# Patient Record
Sex: Female | Born: 1955 | Race: White | Hispanic: No | Smoking: Former smoker
Health system: Southern US, Community
[De-identification: ages and names within clinical notes are randomized; demographics above are authoritative.]

## PROBLEM LIST (undated history)

## (undated) DIAGNOSIS — Z8619 Personal history of other infectious and parasitic diseases: Secondary | ICD-10-CM

## (undated) DIAGNOSIS — E669 Obesity, unspecified: Secondary | ICD-10-CM

## (undated) DIAGNOSIS — K7581 Nonalcoholic steatohepatitis (NASH): Secondary | ICD-10-CM

## (undated) DIAGNOSIS — K635 Polyp of colon: Secondary | ICD-10-CM

## (undated) DIAGNOSIS — R7989 Other specified abnormal findings of blood chemistry: Secondary | ICD-10-CM

## (undated) DIAGNOSIS — A389 Scarlet fever, uncomplicated: Secondary | ICD-10-CM

## (undated) DIAGNOSIS — R519 Headache, unspecified: Secondary | ICD-10-CM

## (undated) DIAGNOSIS — K589 Irritable bowel syndrome without diarrhea: Secondary | ICD-10-CM

## (undated) DIAGNOSIS — N390 Urinary tract infection, site not specified: Secondary | ICD-10-CM

## (undated) DIAGNOSIS — M797 Fibromyalgia: Secondary | ICD-10-CM

## (undated) DIAGNOSIS — D72829 Elevated white blood cell count, unspecified: Secondary | ICD-10-CM

## (undated) DIAGNOSIS — K5792 Diverticulitis of intestine, part unspecified, without perforation or abscess without bleeding: Secondary | ICD-10-CM

## (undated) DIAGNOSIS — R87619 Unspecified abnormal cytological findings in specimens from cervix uteri: Secondary | ICD-10-CM

## (undated) DIAGNOSIS — M1990 Unspecified osteoarthritis, unspecified site: Secondary | ICD-10-CM

## (undated) DIAGNOSIS — R51 Headache: Secondary | ICD-10-CM

## (undated) DIAGNOSIS — E785 Hyperlipidemia, unspecified: Secondary | ICD-10-CM

## (undated) HISTORY — PX: ABDOMINAL HYSTERECTOMY: SUR658

## (undated) HISTORY — DX: Urinary tract infection, site not specified: N39.0

## (undated) HISTORY — DX: Hyperlipidemia, unspecified: E78.5

## (undated) HISTORY — DX: Scarlet fever, uncomplicated: A38.9

## (undated) HISTORY — PX: TONSILLECTOMY: SUR1361

## (undated) HISTORY — DX: Nonalcoholic steatohepatitis (NASH): K75.81

## (undated) HISTORY — DX: Personal history of other infectious and parasitic diseases: Z86.19

## (undated) HISTORY — DX: Unspecified abnormal cytological findings in specimens from cervix uteri: R87.619

## (undated) HISTORY — DX: Elevated white blood cell count, unspecified: D72.829

## (undated) HISTORY — DX: Other specified abnormal findings of blood chemistry: R79.89

## (undated) HISTORY — DX: Unspecified osteoarthritis, unspecified site: M19.90

## (undated) HISTORY — DX: Polyp of colon: K63.5

## (undated) HISTORY — PX: COLPOSCOPY: SHX161

## (undated) HISTORY — DX: Obesity, unspecified: E66.9

## (undated) HISTORY — DX: Irritable bowel syndrome, unspecified: K58.9

---

## 1999-10-03 ENCOUNTER — Other Ambulatory Visit: Admission: RE | Admit: 1999-10-03 | Discharge: 1999-10-03 | Payer: Self-pay | Admitting: Obstetrics and Gynecology

## 2000-12-15 ENCOUNTER — Other Ambulatory Visit: Admission: RE | Admit: 2000-12-15 | Discharge: 2000-12-15 | Payer: Self-pay | Admitting: Obstetrics and Gynecology

## 2002-08-15 ENCOUNTER — Other Ambulatory Visit: Admission: RE | Admit: 2002-08-15 | Discharge: 2002-08-15 | Payer: Self-pay | Admitting: Obstetrics and Gynecology

## 2003-09-05 ENCOUNTER — Other Ambulatory Visit: Admission: RE | Admit: 2003-09-05 | Discharge: 2003-09-05 | Payer: Self-pay | Admitting: Obstetrics and Gynecology

## 2006-04-28 ENCOUNTER — Other Ambulatory Visit: Admission: RE | Admit: 2006-04-28 | Discharge: 2006-04-28 | Payer: Self-pay | Admitting: Obstetrics and Gynecology

## 2008-07-10 ENCOUNTER — Other Ambulatory Visit: Admission: RE | Admit: 2008-07-10 | Discharge: 2008-07-10 | Payer: Self-pay | Admitting: Obstetrics and Gynecology

## 2009-09-12 ENCOUNTER — Encounter: Admission: RE | Admit: 2009-09-12 | Discharge: 2009-09-12 | Payer: Self-pay | Admitting: Obstetrics and Gynecology

## 2010-10-01 ENCOUNTER — Other Ambulatory Visit: Payer: Self-pay | Admitting: Obstetrics and Gynecology

## 2014-03-07 ENCOUNTER — Encounter: Payer: Self-pay | Admitting: Obstetrics and Gynecology

## 2014-04-04 ENCOUNTER — Telehealth: Payer: Self-pay | Admitting: Obstetrics and Gynecology

## 2014-04-04 NOTE — Telephone Encounter (Signed)
Confirming pts appt

## 2014-04-09 NOTE — Telephone Encounter (Signed)
confirmed

## 2014-04-12 ENCOUNTER — Encounter: Payer: Self-pay | Admitting: Obstetrics and Gynecology

## 2014-05-17 ENCOUNTER — Encounter: Payer: Self-pay | Admitting: Hematology and Oncology

## 2014-05-17 ENCOUNTER — Encounter (INDEPENDENT_AMBULATORY_CARE_PROVIDER_SITE_OTHER): Payer: Self-pay

## 2014-05-17 ENCOUNTER — Ambulatory Visit (HOSPITAL_BASED_OUTPATIENT_CLINIC_OR_DEPARTMENT_OTHER): Payer: BC Managed Care – PPO | Admitting: Hematology and Oncology

## 2014-05-17 ENCOUNTER — Ambulatory Visit: Payer: BC Managed Care – PPO

## 2014-05-17 ENCOUNTER — Telehealth: Payer: Self-pay | Admitting: Hematology and Oncology

## 2014-05-17 VITALS — BP 149/85 | HR 93 | Temp 98.8°F | Resp 18 | Wt 197.0 lb

## 2014-05-17 DIAGNOSIS — D72829 Elevated white blood cell count, unspecified: Secondary | ICD-10-CM

## 2014-05-17 DIAGNOSIS — M2550 Pain in unspecified joint: Secondary | ICD-10-CM

## 2014-05-17 DIAGNOSIS — R5382 Chronic fatigue, unspecified: Secondary | ICD-10-CM

## 2014-05-17 DIAGNOSIS — G8929 Other chronic pain: Secondary | ICD-10-CM | POA: Insufficient documentation

## 2014-05-17 NOTE — Assessment & Plan Note (Signed)
She was seen by rheumatologist and was told she has osteoarthritis. I recommend OTC vitamin D supplement and trial of glucosamine with graduated exercise as tolerated

## 2014-05-17 NOTE — Progress Notes (Signed)
Checked in new pt with no financial concerns. °

## 2014-05-17 NOTE — Assessment & Plan Note (Signed)
I gave her advice of graduated exercise, dietary modification and weight loss with goal for 10 pound weight loss over the next 6 months

## 2014-05-17 NOTE — Assessment & Plan Note (Signed)
She felt that she may have chronic Lyme disease. I disagree. I recommend ID consult but she declined. With her obesity, I suggest evaluation for possible OSA but she declined

## 2014-05-17 NOTE — Assessment & Plan Note (Signed)
This is likely reactive in nature from obesity and osteoarthritis. It has been stable for over 3 years. The yield of bone marrow biopsy is low and I recommend observation.

## 2014-05-17 NOTE — Telephone Encounter (Signed)
Gave avs & cal for May 2016.

## 2014-05-17 NOTE — Progress Notes (Signed)
[place] NOTE  Patient Care Team: Aretta Nip, MD as PCP - General (Family Medicine)  CHIEF COMPLAINTS/PURPOSE OF CONSULTATION:  Chronic leukocytosis  HISTORY OF PRESENTING ILLNESS:  Denise Munoz 58 y.o. female is here because of elevated WBC.  She was found to have abnormal CBC from routine blood work monitoring by PCP. From December 2012 to present, her WBC ranged from 14.4 to 15.5 with normal differential without other abnormalities. She denies recent infection. The last prescription antibiotics was more than 3 months ago. She had recurrent UTI X 3 over the past 18 months. There is not reported symptoms of sinus congestion, cough, urinary frequency/urgency or dysuria, diarrhea, or abnormal skin rash. She had tick bite 2 years ago and felt she had persistent infection. She has chronic fatigue syndrome but did not think she has OSA because she had made recording of her sleep and breathing not long ago. She complained of severe neck pain and joint pain and was seen by a rheumatologist, with autoimmune disorders ruled out. She had no prior history or diagnosis of cancer. Her age appropriate screening programs are up-to-date. The patient has no prior diagnosis of autoimmune disease and was not prescribed corticosteroids related products. She was an ex-smoker  MEDICAL HISTORY:  Past Medical History  Diagnosis Date  . Arthritis   . Leukocytosis   . Scarlet fever   . Leukocytosis 05/17/2014    SURGICAL HISTORY: Past Surgical History  Procedure Laterality Date  . Tonsillectomy      SOCIAL HISTORY: History   Social History  . Marital Status: Unknown    Spouse Name: N/A    Number of Children: N/A  . Years of Education: N/A   Occupational History  . Not on file.   Social History Main Topics  . Smoking status: Former Smoker -- 0.50 packs/day for 36 years    Quit date: 05/17/2010  . Smokeless tobacco: Never Used  . Alcohol Use: No  . Drug Use: No   . Sexual Activity: Not on file   Other Topics Concern  . Not on file   Social History Narrative  . No narrative on file    FAMILY HISTORY: Family History  Problem Relation Age of Onset  . Cancer Mother 29    breast ca    ALLERGIES:  has no allergies on file.  MEDICATIONS:  Current Outpatient Prescriptions  Medication Sig Dispense Refill  . acetaminophen (TYLENOL) 325 MG tablet Take 650 mg by mouth every 6 (six) hours as needed.    . cyclobenzaprine (FLEXERIL) 10 MG tablet Take 10 mg by mouth at bedtime as needed for muscle spasms.    Marland Kitchen ibuprofen (ADVIL,MOTRIN) 200 MG tablet Take 200 mg by mouth every 6 (six) hours as needed.    . Multiple Vitamin (MULTIVITAMIN) tablet Take 1 tablet by mouth daily.     No current facility-administered medications for this visit.    REVIEW OF SYSTEMS:   Constitutional: Denies fevers, chills or abnormal night sweats Eyes: Denies blurriness of vision, double vision or watery eyes Ears, nose, mouth, throat, and face: Denies mucositis or sore throat Respiratory: Denies cough, dyspnea or wheezes Cardiovascular: Denies palpitation, chest discomfort or lower extremity swelling Gastrointestinal:  Denies nausea, heartburn or change in bowel habits Skin: Denies abnormal skin rashes Lymphatics: Denies new lymphadenopathy or easy bruising Neurological:Denies numbness, tingling or new weaknesses Behavioral/Psych: Mood is stable, no new changes  All other systems were reviewed with the patient and are negative.  PHYSICAL  EXAMINATION: ECOG PERFORMANCE STATUS: 1 - Symptomatic but completely ambulatory  Filed Vitals:   05/17/14 1349  BP: 149/85  Pulse: 93  Temp: 98.8 F (37.1 C)  Resp: 18   Filed Weights   05/17/14 1349  Weight: 197 lb (89.359 kg)    GENERAL:alert, no distress and comfortable. She is morbidly obese SKIN: skin color, texture, turgor are normal, no rashes or significant lesions EYES: normal, conjunctiva are pink and  non-injected, sclera clear OROPHARYNX:no exudate, no erythema and lips, buccal mucosa, and tongue normal  NECK: supple, thyroid normal size, non-tender, without nodularity LYMPH:  no palpable lymphadenopathy in the cervical, axillary or inguinal LUNGS: clear to auscultation and percussion with normal breathing effort HEART: regular rate & rhythm and no murmurs and no lower extremity edema ABDOMEN:abdomen soft, non-tender and normal bowel sounds Musculoskeletal:no cyanosis of digits and no clubbing  PSYCH: alert & oriented x 3 with fluent speech NEURO: no focal motor/sensory deficits  LABORATORY DATA:  I have reviewed the data as listed  ASSESSMENT & PLAN Leukocytosis This is likely reactive in nature from obesity and osteoarthritis. It has been stable for over 3 years. The yield of bone marrow biopsy is low and I recommend observation.  Morbid obesity I gave her advice of graduated exercise, dietary modification and weight loss with goal for 10 pound weight loss over the next 6 months  Chronic fatigue She felt that she may have chronic Lyme disease. I disagree. I recommend ID consult but she declined. With her obesity, I suggest evaluation for possible OSA but she declined  Chronic pain of multiple joints She was seen by rheumatologist and was told she has osteoarthritis. I recommend OTC vitamin D supplement and trial of glucosamine with graduated exercise as tolerated

## 2014-05-18 ENCOUNTER — Encounter: Payer: Self-pay | Admitting: Obstetrics and Gynecology

## 2014-05-21 ENCOUNTER — Ambulatory Visit (INDEPENDENT_AMBULATORY_CARE_PROVIDER_SITE_OTHER): Payer: BC Managed Care – PPO | Admitting: Obstetrics and Gynecology

## 2014-05-21 ENCOUNTER — Encounter: Payer: Self-pay | Admitting: Obstetrics and Gynecology

## 2014-05-21 VITALS — BP 140/98 | HR 88 | Resp 16 | Ht 61.5 in | Wt 198.0 lb

## 2014-05-21 DIAGNOSIS — R319 Hematuria, unspecified: Secondary | ICD-10-CM

## 2014-05-21 DIAGNOSIS — Z01419 Encounter for gynecological examination (general) (routine) without abnormal findings: Secondary | ICD-10-CM

## 2014-05-21 DIAGNOSIS — Z0000 Encounter for general adult medical examination without abnormal findings: Secondary | ICD-10-CM

## 2014-05-21 DIAGNOSIS — R1900 Intra-abdominal and pelvic swelling, mass and lump, unspecified site: Secondary | ICD-10-CM

## 2014-05-21 LAB — POCT URINALYSIS DIPSTICK
Bilirubin, UA: NEGATIVE
GLUCOSE UA: NEGATIVE
Ketones, UA: NEGATIVE
LEUKOCYTES UA: NEGATIVE
NITRITE UA: NEGATIVE
PROTEIN UA: NEGATIVE
UROBILINOGEN UA: NEGATIVE
pH, UA: 5

## 2014-05-21 NOTE — Patient Instructions (Signed)

## 2014-05-21 NOTE — Progress Notes (Signed)
58 y.o. G1P1000 UnknownCaucasianF here for annual exam.    PCP - Milagros Evener, MD  Sees Dr. Alvy Bimler for leukocytosis, joint pain and fatigue following a tick bite.  Had elevated sed rate.   No prior testing for diabetes.   3 UTIs in the last year.  Some mild genuine stress incontinence.   Has spent time in [place] many year ago.   Patient's last menstrual period was 05/13/2006.          Sexually active: No.  The current method of family planning is post menopausal status.    Exercising: No.  The patient does not participate in regular exercise at present. Smoker:  no  Health Maintenance: Pap:  05/2012 neg History of abnormal Pap:  yes MMG:  09/2009 BIRADS1: neg Colonoscopy:  N/A BMD:   N/A TDaP:  Within 5 years  Screening Labs: PCP, Hb today: PCP, Urine today: LAG:TXMIW. No dysuria.  Some frequency.    reports that she quit smoking about 4 years ago. She has never used smokeless tobacco. She reports that she does not drink alcohol or use illicit drugs.  Past Medical History  Diagnosis Date  . Arthritis   . Leukocytosis   . Scarlet fever   . Leukocytosis 05/17/2014  . Abnormal Pap smear of cervix ~1986  . History of mumps as a child     Past Surgical History  Procedure Laterality Date  . Tonsillectomy    . Colposcopy  ~1986    Normal    Current Outpatient Prescriptions  Medication Sig Dispense Refill  . acetaminophen (TYLENOL) 325 MG tablet Take 650 mg by mouth every 6 (six) hours as needed.    . cyclobenzaprine (FLEXERIL) 10 MG tablet Take 10 mg by mouth at bedtime as needed for muscle spasms.    Marland Kitchen ibuprofen (ADVIL,MOTRIN) 200 MG tablet Take 200 mg by mouth every 6 (six) hours as needed.    . Multiple Vitamin (MULTIVITAMIN) tablet Take 1 tablet by mouth daily.     No current facility-administered medications for this visit.    Family History  Problem Relation Age of Onset  . Breast cancer Mother 70  . Alzheimer's disease Father 48  . Breast cancer  Maternal Grandmother 70    ROS:  Pertinent items are noted in HPI.  Otherwise, a comprehensive ROS was negative.  Exam:   BP 140/98 mmHg  Pulse 88  Resp 16  Ht 5' 1.5" (1.562 m)  Wt 198 lb (89.812 kg)  BMI 36.81 kg/m2  LMP 05/13/2006     Height: 5' 1.5" (156.2 cm)  Ht Readings from Last 3 Encounters:  05/21/14 5' 1.5" (1.562 m)    General appearance: alert, cooperative and appears stated age Head: Normocephalic, without obvious abnormality, atraumatic Neck: no adenopathy, supple, symmetrical, trachea midline and thyroid normal to inspection and palpation Lungs: clear to auscultation bilaterally Breasts: normal appearance, no masses or tenderness, Inspection negative, No nipple retraction or dimpling, No nipple discharge or bleeding, No axillary or supraclavicular adenopathy Heart: regular rate and rhythm Abdomen: soft, non-tender; bowel sounds normal; no masses,  no organomegaly Extremities: extremities normal, atraumatic, no cyanosis or edema Skin: Skin color, texture, turgor normal. No rashes or lesions Lymph nodes: Cervical, supraclavicular, and axillary nodes normal. No abnormal inguinal nodes palpated Neurologic: Grossly normal   Pelvic: External genitalia:  no lesions              Urethra:  normal appearing urethra with no masses, tenderness or lesions  Bartholins and Skenes: normal                 Vagina: normal appearing vagina with normal color and discharge, no lesions              Cervix: no lesions              Pap taken: Yes.   Bimanual Exam:  Uterus:  mass  14 week size mass - Fibroid?              Adnexa: Not palpated separately from the uterus.                Rectovaginal: Confirms               Anus:  normal sphincter tone, no lesions  A:  Well Woman with normal exam Microscopic hematuria. History of recurrent UTIs.  Family history of breast cancer.  Pelvic mass.   P:   Mammogram due.  Patient will schedule. pap smear and HR HPV testing  performed. Return for pelvic ultrasound Urine micro and culture.  return annually or prn  An After Visit Summary was printed and given to the patient.

## 2014-05-22 LAB — URINALYSIS, MICROSCOPIC ONLY
BACTERIA UA: NONE SEEN
CASTS: NONE SEEN
Crystals: NONE SEEN
Squamous Epithelial / LPF: NONE SEEN

## 2014-05-23 LAB — URINE CULTURE
COLONY COUNT: NO GROWTH
ORGANISM ID, BACTERIA: NO GROWTH

## 2014-05-23 LAB — IPS PAP TEST WITH HPV

## 2014-05-24 ENCOUNTER — Ambulatory Visit (INDEPENDENT_AMBULATORY_CARE_PROVIDER_SITE_OTHER): Payer: BC Managed Care – PPO

## 2014-05-24 ENCOUNTER — Encounter: Payer: Self-pay | Admitting: Obstetrics and Gynecology

## 2014-05-24 ENCOUNTER — Ambulatory Visit (INDEPENDENT_AMBULATORY_CARE_PROVIDER_SITE_OTHER): Payer: BC Managed Care – PPO | Admitting: Obstetrics and Gynecology

## 2014-05-24 VITALS — BP 142/90 | HR 76 | Ht 61.5 in | Wt 198.0 lb

## 2014-05-24 DIAGNOSIS — N8320 Unspecified ovarian cysts: Secondary | ICD-10-CM

## 2014-05-24 DIAGNOSIS — N83201 Unspecified ovarian cyst, right side: Secondary | ICD-10-CM

## 2014-05-24 DIAGNOSIS — R1900 Intra-abdominal and pelvic swelling, mass and lump, unspecified site: Secondary | ICD-10-CM

## 2014-05-24 DIAGNOSIS — N83202 Unspecified ovarian cyst, left side: Principal | ICD-10-CM

## 2014-05-24 DIAGNOSIS — D251 Intramural leiomyoma of uterus: Secondary | ICD-10-CM | POA: Insufficient documentation

## 2014-05-24 LAB — COMPREHENSIVE METABOLIC PANEL
ALT: 56 U/L — AB (ref 0–35)
AST: 41 U/L — AB (ref 0–37)
Albumin: 4.2 g/dL (ref 3.5–5.2)
Alkaline Phosphatase: 129 U/L — ABNORMAL HIGH (ref 39–117)
BUN: 13 mg/dL (ref 6–23)
CALCIUM: 9.3 mg/dL (ref 8.4–10.5)
CHLORIDE: 105 meq/L (ref 96–112)
CO2: 28 meq/L (ref 19–32)
CREATININE: 0.58 mg/dL (ref 0.50–1.10)
Glucose, Bld: 96 mg/dL (ref 70–99)
Potassium: 4.5 mEq/L (ref 3.5–5.3)
SODIUM: 143 meq/L (ref 135–145)
TOTAL PROTEIN: 7.7 g/dL (ref 6.0–8.3)
Total Bilirubin: 0.4 mg/dL (ref 0.2–1.2)

## 2014-05-24 LAB — CBC WITH DIFFERENTIAL/PLATELET
BASOS ABS: 0 10*3/uL (ref 0.0–0.1)
Basophils Relative: 0 % (ref 0–1)
EOS PCT: 0 % (ref 0–5)
Eosinophils Absolute: 0 10*3/uL (ref 0.0–0.7)
HCT: 41.7 % (ref 36.0–46.0)
Hemoglobin: 14.4 g/dL (ref 12.0–15.0)
LYMPHS ABS: 3.3 10*3/uL (ref 0.7–4.0)
LYMPHS PCT: 26 % (ref 12–46)
MCH: 30.2 pg (ref 26.0–34.0)
MCHC: 34.5 g/dL (ref 30.0–36.0)
MCV: 87.4 fL (ref 78.0–100.0)
Monocytes Absolute: 1 10*3/uL (ref 0.1–1.0)
Monocytes Relative: 8 % (ref 3–12)
NEUTROS ABS: 8.3 10*3/uL — AB (ref 1.7–7.7)
NEUTROS PCT: 66 % (ref 43–77)
PLATELETS: 259 10*3/uL (ref 150–400)
RBC: 4.77 MIL/uL (ref 3.87–5.11)
RDW: 13.6 % (ref 11.5–15.5)
WBC: 12.6 10*3/uL — AB (ref 4.0–10.5)

## 2014-05-24 NOTE — Progress Notes (Signed)
  Subjective  Patient is here today for pelvic ultrasound for a mass found on routine pelvic exam.  Patient is menopausal and has no postmenopausal bleeding.  She is not on any HRT.  Last pap 05/21/14 - WNL and negative HR HPV.   States she does not have any pain.  Just feels fatigued.  Can leak urine if coughs really hard.  Some night time urination.  Not sexually active for a long time.   Hx of elevated WBC and sed rate.  Saw Dr. Alvy Bimler at [facility].  Objective  Pelvic ultrasound images and report reviewed with patient.   Uterus with 3 intramural fibroids, largest 3.7 cm. EMS distorted and measuring 4.95 mm. Complex ovarian cysts - right 12.3 cm in largest diameter and left 8.5 cm in largest diameter.  Both mutiloculated with smooth borders and no abnormal flow. No free fluid.     Assessment Uterine fibroids. Bilateral complex ovarian cysts.  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 Elevated WBC and sed rate.   Plan  CA125, CEA, CBC with diff, CMP.  I discussed with patient the findings and recommendation for total abdominal hysterectomy with bilateral salpingo-oophorectomy.  I will refer the patient to [facility] for a second opinion.  If has elevated tumor markers, they will manage the patient's surgical care.  She has an understanding of the findings today and the plan for surgical care.   25 minutes face to face time of which over 50% was spent in counseling.   After visit summary to patient.

## 2014-05-24 NOTE — Patient Instructions (Signed)
Ovarian Cyst An ovarian cyst is a fluid-filled sac that forms on an ovary. The ovaries are small organs that produce eggs in women. Various types of cysts can form on the ovaries. Most are not cancerous. Many do not cause problems, and they often go away on their own. Some may cause symptoms and require treatment. Common types of ovarian cysts include:  Functional cysts--These cysts may occur every month during the menstrual cycle. This is normal. The cysts usually go away with the next menstrual cycle if the woman does not get pregnant. Usually, there are no symptoms with a functional cyst.  Endometrioma cysts--These cysts form from the tissue that lines the uterus. They are also called "chocolate cysts" because they become filled with blood that turns brown. This type of cyst can cause pain in the lower abdomen during intercourse and with your menstrual period.  Cystadenoma cysts--This type develops from the cells on the outside of the ovary. These cysts can get very big and cause lower abdomen pain and pain with intercourse. This type of cyst can twist on itself, cut off its blood supply, and cause severe pain. It can also easily rupture and cause a lot of pain.  Dermoid cysts--This type of cyst is sometimes found in both ovaries. These cysts may contain different kinds of body tissue, such as skin, teeth, hair, or cartilage. They usually do not cause symptoms unless they get very big.  Theca lutein cysts--These cysts occur when too much of a certain hormone (human chorionic gonadotropin) is produced and overstimulates the ovaries to produce an egg. This is most common after procedures used to assist with the conception of a baby (in vitro fertilization). CAUSES   Fertility drugs can cause a condition in which multiple large cysts are formed on the ovaries. This is called ovarian hyperstimulation syndrome.  A condition called polycystic ovary syndrome can cause hormonal imbalances that can lead to  nonfunctional ovarian cysts. SIGNS AND SYMPTOMS  Many ovarian cysts do not cause symptoms. If symptoms are present, they may include:  Pelvic pain or pressure.  Pain in the lower abdomen.  Pain during sexual intercourse.  Increasing girth (swelling) of the abdomen.  Abnormal menstrual periods.  Increasing pain with menstrual periods.  Stopping having menstrual periods without being pregnant. DIAGNOSIS  These cysts are commonly found during a routine or annual pelvic exam. Tests may be ordered to find out more about the cyst. These tests may include:  Ultrasound.  X-ray of the pelvis.  CT scan.  MRI.  Blood tests. TREATMENT  Many ovarian cysts go away on their own without treatment. Your health care provider may want to check your cyst regularly for 2-3 months to see if it changes. For women in menopause, it is particularly important to monitor a cyst closely because of the higher rate of ovarian cancer in menopausal women. When treatment is needed, it may include any of the following:  A procedure to drain the cyst (aspiration). This may be done using a long needle and ultrasound. It can also be done through a laparoscopic procedure. This involves using a thin, lighted tube with a tiny camera on the end (laparoscope) inserted through a small incision.  Surgery to remove the whole cyst. This may be done using laparoscopic surgery or an open surgery involving a larger incision in the lower abdomen.  Hormone treatment or birth control pills. These methods are sometimes used to help dissolve a cyst. HOME CARE INSTRUCTIONS   Only take over-the-counter  or prescription medicines as directed by your health care provider.  Follow up with your health care provider as directed.  Get regular pelvic exams and Pap tests. SEEK MEDICAL CARE IF:   Your periods are late, irregular, or painful, or they stop.  Your pelvic pain or abdominal pain does not go away.  Your abdomen becomes  larger or swollen.  You have pressure on your bladder or trouble emptying your bladder completely.  You have pain during sexual intercourse.  You have feelings of fullness, pressure, or discomfort in your stomach.  You lose weight for no apparent reason.  You feel generally ill.  You become constipated.  You lose your appetite.  You develop acne.  You have an increase in body and facial hair.  You are gaining weight, without changing your exercise and eating habits.  You think you are pregnant. SEEK IMMEDIATE MEDICAL CARE IF:   You have increasing abdominal pain.  You feel sick to your stomach (nauseous), and you throw up (vomit).  You develop a fever that comes on suddenly.  You have abdominal pain during a bowel movement.  Your menstrual periods become heavier than usual. MAKE SURE YOU:  Understand these instructions.  Will watch your condition.  Will get help right away if you are not doing well or get worse. Document Released: 06/29/2005 Document Revised: 07/04/2013 Document Reviewed: 03/06/2013 [facility] Patient Information 2015 [place], [place]. This information is not intended to replace advice given to you by your health care provider. Make sure you discuss any questions you have with your health care provider.  Abdominal Hysterectomy Abdominal hysterectomy is a surgical procedure to remove your womb (uterus). Your uterus is the muscular organ that contains a developing baby. This surgery is done for many reasons. You may need an abdominal hysterectomy if you have cancer, growths (tumors), long-term pain, or bleeding. You may also have this procedure if your uterus has slipped down into your vagina (uterine prolapse). Depending on why you need an abdominal hysterectomy, you may also have other reproductive organs removed. These could include the part of your vagina that connects with your uterus (cervix), the organs that make eggs (ovaries), and the tubes that  connect the ovaries to the uterus (fallopian tubes). LET [facility] CARE PROVIDER KNOW ABOUT:   Any allergies you have.  All medicines you are taking, including vitamins, herbs, eye drops, creams, and over-the-counter medicines.  Previous problems you or members of your family have had with the use of anesthetics.  Any blood disorders you have.  Previous surgeries you have had.  Medical conditions you have. RISKS AND COMPLICATIONS Generally, this is a safe procedure. However, as with any procedure, problems can occur. Infection is the most common problem after an abdominal hysterectomy. Other possible problems include:  Bleeding.  Formation of blood clots that may break free and travel to your lungs.  Injury to other organs near your uterus.  Nerve injury causing nerve pain.  Decreased interest in sex or pain during sexual intercourse. BEFORE THE PROCEDURE  Abdominal hysterectomy is a major surgical procedure. It can affect the way you feel about yourself. Talk to your health care provider about the physical and emotional changes hysterectomy may cause.  You may need to have blood work and X-rays done before surgery.  Quit smoking if you smoke. Ask your health care provider for help if you are struggling to quit.  Stop taking medicines that thin your blood as directed by your health care provider.  You may be instructed to take antibiotic medicines or laxatives before surgery.  Do not eat or drink anything for 6-8 hours before surgery.  Take your regular medicines with a small sip of water.  Bathe or shower the night or morning before surgery. PROCEDURE  Abdominal hysterectomy is done in the operating room at the hospital.  In most cases, you will be given a medicine that makes you go to sleep (general anesthetic).  The surgeon will make a cut (incision) through the skin in your lower belly.  The incision may be about 5-7 inches long. It may go side-to-side or  up-and-down.  The surgeon will move aside the body tissue that covers your uterus. The surgeon will then carefully take out your uterus along with any of your other reproductive organs that need to be removed.  Bleeding will be controlled with clamps or sutures.  The surgeon will close your incision with sutures or metal clips. AFTER THE PROCEDURE  You will have some pain immediately after the procedure.  You will be given pain medicine in the recovery room.  You will be taken to your hospital room when you have recovered from the anesthesia.  You may need to stay in the hospital for 2-5 days.  You will be given instructions for recovery at home. Document Released: 07/04/2013 Document Reviewed: 07/04/2013 [facility] Patient Information 2015 [place], [place]. This information is not intended to replace advice given to you by your health care provider. Make sure you discuss any questions you have with your health care provider.

## 2014-05-25 LAB — CA 125: CA 125: 16 U/mL (ref <35)

## 2014-05-25 LAB — CEA: CEA: 2.3 ng/mL (ref 0.0–5.0)

## 2014-05-29 ENCOUNTER — Telehealth: Payer: Self-pay | Admitting: *Deleted

## 2014-05-29 NOTE — Telephone Encounter (Signed)
Call to [place] regarding referral appointment. LMTCB.   Call to patient. Advised referral in process and may receive MY Chart message regarding appointment even before I hear back regarding appointment. Surgery date options discussed for January. Will proceed with scheduling and call her back when surgery and appointments finalized.   Case request for 07-17-14 sent.

## 2014-05-30 NOTE — Telephone Encounter (Signed)
Per [facility], Appointment scheduled with Dr Skeet Latch for Monday 06-11-14 at 1100, resister at 1030.

## 2014-05-30 NOTE — Telephone Encounter (Signed)
Spoke with patient regarding surgery scheduled for 07/17/2014. Advised that I was able to obtain 2016 benefits from health plan and that based on the benefit quote received, she will be responsible to pay $1120.84 for the surgeons portion of the surgery. Advised that she will receive separate communication from the hospital regarding there fees/billing. Advised patient that per our office policy, payment is due in full at least 2 weeks prior to her scheduled surgery date. Surgery is 01.05.2016, payment is due 12.22.2015. Patient states that she did not know that she would have to pay upfront for services not rendered. States that she would like to know if an arrangement can be made due to it being the Christmas season. Patient also states that her flex dollars will re-up 07/2014 and is wondering if she can make partial payment prior to surgery and balance at the beginning of 2016. Advised that I would speak to administration regarding her request and that either administration or myself will give her a call back once a decision has been made. Patient agreeable. States that if an arrangement cannot be made, she may want to look into pushing her surgery date back a little.

## 2014-05-31 NOTE — Telephone Encounter (Signed)
Spoke with patient again regarding surgery costs (surgeons fees)...advised that after discussing her account with office administration, I needed to know how much she is able to pay prior to her surgery date. Patient states that it is the Christmas season and that she intentionally boosted her flex $ to cover this surgery and was unaware that she would have to pay anything upfront. I asked the patient if she could pay 1/3 of her patient liability ($373.61) prior to the surgery (due by 12.22.2015) and then pay the balance ($315.17) with a due date of July 16, 2014. Patient agreeable.

## 2014-05-31 NOTE — Telephone Encounter (Signed)
Thank you.   Denise Munoz

## 2014-06-01 NOTE — Telephone Encounter (Signed)
Call to patient. Advised of appointment with Dr Janie Morning at [facility], Monday, November 30 at 1100, arrive 1030 to register and expect pelvic exam. Reviewed surgery instruction sheet and printed copy mailed to patient.  Routing to provider for final review. Patient agreeable to disposition. Will close encounter

## 2014-06-11 ENCOUNTER — Encounter: Payer: Self-pay | Admitting: Gynecologic Oncology

## 2014-06-11 ENCOUNTER — Ambulatory Visit: Payer: BC Managed Care – PPO | Attending: Gynecologic Oncology | Admitting: Gynecologic Oncology

## 2014-06-11 VITALS — BP 146/91 | HR 92 | Temp 98.0°F | Resp 18 | Ht 61.5 in | Wt 198.8 lb

## 2014-06-11 DIAGNOSIS — Z87891 Personal history of nicotine dependence: Secondary | ICD-10-CM | POA: Diagnosis not present

## 2014-06-11 DIAGNOSIS — Z79899 Other long term (current) drug therapy: Secondary | ICD-10-CM | POA: Diagnosis not present

## 2014-06-11 DIAGNOSIS — N83202 Unspecified ovarian cyst, left side: Secondary | ICD-10-CM

## 2014-06-11 DIAGNOSIS — N8320 Unspecified ovarian cysts: Secondary | ICD-10-CM | POA: Insufficient documentation

## 2014-06-11 DIAGNOSIS — N83201 Unspecified ovarian cyst, right side: Secondary | ICD-10-CM

## 2014-06-11 NOTE — Progress Notes (Signed)
Consult Note: Gyn-Onc  Consult was requested by Dr. Quincy Simmonds for the evaluation of Denise Munoz 58 y.o. female  CC:  Chief Complaint  Patient presents with  . Ovarian Cyst    Assessment/Plan:  [name]  is a 58 y.o. with bilateral complex adnexal masses, thick walls, avascular with a normal CA 125.  Patient was counseled that the ultrasound findings and CA 125 lean toward a benign diagnosis but there is the possibility that malignancy could be identified intraoperatively.  TAH BSO scheduled with Dr. Quincy Simmonds 07/17/2014.  Patient is aware that if malignancy is identified she will be seen by the [facility] team very quickly. a minimally invasive approach for   subsequent staging (LND, omentectomy, bx, possible appendectomy) will be attempted.    It is the preference of Ms Denise Munoz to proceed with surgery with Dr. Quincy Simmonds as scheduled 07/17/2014.  I agree with her decision.  HPI: Ms. Denise Munoz  is a 58 y.o.   G1 who presented to Dr. Quincy Simmonds for her annual examination and was noted to have a pelvic mass.  UTZ  05/24/2014 notable for bilateral cystic masses.  Right ovary thick walled multiloculated cyst 12.3x7.7x8.5cm avascular, borders appear smooth Left ovary thick walled cystic mass 6.9x4.7x8.5cm, avascular. No abdominal bloating, intermittent early satiety, recent fatigue and arthritis since a tick bite.    No vaginal discharge or bleeding, , soft stool, no diarrhea or constipation.  Denies weight loss.    CA 125 = 16 CEA 2.3   Review of Systems:  Constitutional  Feels well,  difficulty loosing weight.  Recent increase in fatigue,  Cardiovascular  No chest pain, shortness of breath, or edema  Pulmonary  No cough or wheeze.  Gastro Intestinal  No nausea, vomitting, or diarrhoea. No bright red blood per rectum, no abdominal pain, change in bowel movement, or constipation. No increase in abdominal girth.   Genito Urinary  No frequency, urgency, dysuria, no abnormal bleeding or discharge,  no pelvic pressure Musculo Skeletal  No myalgia, reports migratory arthralgia, occasional joint pain .  Tick bite several months ago.  Initial Lyme disease workup is negative Neurologic  No weakness, numbness, change in gait,  Psychology  No depression, anxiety, insomnia.    Current Meds:  Outpatient Encounter Prescriptions as of 06/11/2014  Medication Sig  . acetaminophen (TYLENOL) 325 MG tablet Take 650 mg by mouth every 6 (six) hours as needed.  . cyclobenzaprine (FLEXERIL) 10 MG tablet Take 10 mg by mouth at bedtime as needed for muscle spasms.  Marland Kitchen ibuprofen (ADVIL,MOTRIN) 200 MG tablet Take 200 mg by mouth every 6 (six) hours as needed.  . Multiple Vitamin (MULTIVITAMIN) tablet Take 1 tablet by mouth daily.    Allergy:  Allergies  Allergen Reactions  . Doxycycline Nausea Only    diarrhea     Social Hx:   History   Social History  . Marital Status: Unknown    Spouse Name: N/A    Number of Children: N/A  . Years of Education: N/A   Occupational History  . Not on file.   Social History Main Topics  . Smoking status: Former Smoker -- 0.50 packs/day for 36 years    Quit date: 05/17/2010  . Smokeless tobacco: Never Used  . Alcohol Use: No  . Drug Use: No  . Sexual Activity: Not Currently    Birth Control/ Protection: Post-menopausal   Other Topics Concern  . Not on file   Social History Narrative    Past  Surgical Hx:  Past Surgical History  Procedure Laterality Date  . Tonsillectomy    . Colposcopy  ~1986    Normal    Past Medical Hx:  Past Medical History  Diagnosis Date  . Arthritis   . Leukocytosis   . Scarlet fever   . Leukocytosis 05/17/2014  . Abnormal Pap smear of cervix ~1986  . History of mumps as a child     Past Gynecological History: G1Pi menarche 39, irregular menses   Patient's last menstrual period was 05/13/2006.  H/O OCP use for 5 years.  Abn pap in 1985.  Pap 2015 wnl  Family Hx:  Family History  Problem Relation Age of Onset   . Breast cancer Mother 56  . Alzheimer's disease Father 30  . Breast cancer Maternal Grandmother 53    Vitals:  Blood pressure 146/91, pulse 92, temperature 98 F (36.7 C), temperature source Oral, resp. rate 18, height 5' 1.5" (1.562 m), weight 198 lb 12.8 oz (90.175 kg), last menstrual period 05/13/2006.  Physical Exam: WD in NAD Neck  Supple NROM, without any enlargements.  Lymph Node Survey No cervical supraclavicular or inguinal adenopathy Cardiovascular  Pulse normal rate, regularity and rhythm. S1 and S2 normal.  Lungs  Clear to auscultation bilaterally, without wheezes/crackles/rhonchi. Good air movement.  Skin  No rash/lesions/breakdown  Psychiatry  Alert and oriented appropriate mood affect speech and reasoning. Abdomen  Normoactive bowel sounds, abdomen soft, non-tender. No palpable omental cake or ascites Back No CVA tenderness Genito Urinary  Vulva/vagina: Normal external female genitalia.  No lesions. No discharge or bleeding.  Bladder/urethra:  No lesions or masses  Vagina:atrohphic, no lesions  Cervix: Normal appearing, no lesions.  Uterus: Mobile, no parametrial involvement or nodularity, unable to assess size secondary to body habitus  Adnexa: Midline 13 cm slightly mobile mass Rectal  Good tone, no masses no cul de sac nodularity. Midline soft cul de sac mass.   Extremities  No bilateral cyanosis, clubbing or edema.   Janie Morning, MD, PhD 06/11/2014, 11:50 AM

## 2014-06-11 NOTE — Patient Instructions (Signed)
Followup with [place] as needed following hysterectomy in January 2016 with Dr. Quincy Simmonds.

## 2014-06-28 ENCOUNTER — Other Ambulatory Visit: Payer: Self-pay

## 2014-06-28 ENCOUNTER — Ambulatory Visit (INDEPENDENT_AMBULATORY_CARE_PROVIDER_SITE_OTHER): Payer: BC Managed Care – PPO | Admitting: Obstetrics and Gynecology

## 2014-06-28 ENCOUNTER — Encounter: Payer: Self-pay | Admitting: Obstetrics and Gynecology

## 2014-06-28 ENCOUNTER — Other Ambulatory Visit: Payer: Self-pay | Admitting: Obstetrics and Gynecology

## 2014-06-28 VITALS — BP 134/90 | HR 84 | Resp 16 | Ht 61.5 in

## 2014-06-28 DIAGNOSIS — Z1231 Encounter for screening mammogram for malignant neoplasm of breast: Secondary | ICD-10-CM

## 2014-06-28 DIAGNOSIS — N8320 Unspecified ovarian cysts: Secondary | ICD-10-CM

## 2014-06-28 DIAGNOSIS — N83202 Unspecified ovarian cyst, left side: Principal | ICD-10-CM

## 2014-06-28 DIAGNOSIS — N83201 Unspecified ovarian cyst, right side: Secondary | ICD-10-CM

## 2014-06-28 DIAGNOSIS — D259 Leiomyoma of uterus, unspecified: Secondary | ICD-10-CM

## 2014-06-28 NOTE — Progress Notes (Signed)
Patient ID: Denise Munoz, female   DOB: 1955-08-07, 58 y.o.   MRN: [id number]  GYNECOLOGY VISIT  PCP:   Referring provider:   HPI: 58 y.o.     Caucasian  female   G1P1000 with Patient's last menstrual period was 05/13/2006.   here for surgical consultation.   Patient has a pelvic mass found on routine pelvic exam.  Patient is menopausal and has no postmenopausal bleeding.  She is not on any HRT.   States she does not have any pain.  Just feels fatigued.  Can leak urine if coughs really hard.  Some night time urination.  Not sexually active for a long time.   Hx of elevated WBC and sed rate.  Saw Dr. Alvy Bimler at [facility].  Pelvic ultrasound 05/24/14 -  Uterus with 3 intramural fibroids, largest 3.7 cm. EMS distorted and measuring 4.95 mm. Complex ovarian cysts - right 12.3 cm in largest diameter and left 8.5 cm in largest diameter. Both mutiloculated with smooth borders and no abnormal flow. No free fluid.   CA125 - 16 CEA - 2.3  Saw Dr. Adine Madura of GYN/ONC and was told risk of malignancy is low.   Can leak urine when lying in bed at night and coughs or sneezes.  No leak if standing and coughing or sneezing.   GYNECOLOGIC HISTORY: Patient's last menstrual period was 05/13/2006. Sexually active:  no   Contraception:  NA  Menopausal hormone therapy: no DES exposure:   No Blood transfusions:   no Sexually transmitted diseases:   HPV GYN procedures and prior surgeries:  Colposcopy.  Last mammogram:   2011.    Last pap and high risk HPV testing:   05/21/14 - norma l, negative HR HPV History of abnormal pap smear:  Yes. 1985.  No treatment.    OB History    Gravida Para Term Preterm AB TAB SAB Ectopic Multiple Living   1 1 1               Family History  Problem Relation Age of Onset  . Breast cancer Mother 14  . Alzheimer's disease Father 8  . Breast cancer Maternal Grandmother 70    Patient Active Problem List   Diagnosis Date  Noted  . Fibroids, intramural 05/24/2014  . Bilateral ovarian cysts 05/24/2014  . Leukocytosis 05/17/2014  . Morbid obesity 05/17/2014  . Chronic fatigue 05/17/2014  . Chronic pain of multiple joints 05/17/2014   Past Medical History  Diagnosis Date  . Arthritis   . Leukocytosis   . Scarlet fever   . Leukocytosis 05/17/2014  . Abnormal Pap smear of cervix ~1986  . History of mumps as a child     Past Surgical History  Procedure Laterality Date  . Tonsillectomy    . Colposcopy  ~1986    Normal    ALLERGIES: Doxycycline  Current Outpatient Prescriptions  Medication Sig Dispense Refill  . acetaminophen (TYLENOL) 325 MG tablet Take 650 mg by mouth every 6 (six) hours as needed.    Marland Kitchen ibuprofen (ADVIL,MOTRIN) 200 MG tablet Take 200 mg by mouth every 6 (six) hours as needed.    . Multiple Vitamin (MULTIVITAMIN) tablet Take 1 tablet by mouth daily.    . cyclobenzaprine (FLEXERIL) 10 MG tablet Take 10 mg by mouth at bedtime as needed for muscle spasms.     No current facility-administered medications for this visit.     ROS:  Pertinent items are noted in HPI.  History   Social  History  . Marital Status: Unknown    Spouse Name: N/A    Number of Children: N/A  . Years of Education: N/A   Occupational History  . Not on file.   Social History Main Topics  . Smoking status: Former Smoker -- 0.50 packs/day for 36 years    Quit date: 05/17/2010  . Smokeless tobacco: Never Used  . Alcohol Use: No  . Drug Use: No  . Sexual Activity: Not Currently    Birth Control/ Protection: Post-menopausal   Other Topics Concern  . Not on file   Social History Narrative    PHYSICAL EXAMINATION:    BP 134/90 mmHg  Pulse 84  Resp 16  Ht 5' 1.5" (1.562 m)  Wt   LMP 05/13/2006   Wt Readings from Last 3 Encounters:  06/11/14 198 lb 12.8 oz (90.175 kg)  05/24/14 198 lb (89.812 kg)  05/21/14 198 lb (89.812 kg)     Ht Readings from Last 3 Encounters:  06/28/14 5' 1.5" (1.562 m)   06/11/14 5' 1.5" (1.562 m)  05/24/14 5' 1.5" (1.562 m)    General appearance: alert, cooperative and appears stated age Head: Normocephalic, without obvious abnormality, atraumatic Neck: no adenopathy, supple, symmetrical, trachea midline and thyroid not enlarged, symmetric, no tenderness/mass/nodules Lungs: clear to auscultation bilaterally Heart: regular rate and rhythm Abdomen: soft, non-tender; mass to a few cm below umbilicus, nontender. Extremities: extremities normal, atraumatic, no cyanosis or edema Skin: Skin color, texture, turgor normal. No rashes or lesions Lymph nodes: Cervical, supraclavicular, and axillary nodes normal. No abnormal inguinal nodes palpated Neurologic: Grossly normal  Pelvic: External genitalia:  no lesions              Urethra:  normal appearing urethra with no masses, tenderness or lesions              Bartholins and Skenes: normal                 Vagina: normal appearing vagina with normal color and discharge, no lesions              Cervix: normal appearance                  Bimanual Exam:  Uterus:  uterus is 16 - 17 week size and nontender.  Uterus not palpated separately from ovaries.                                       Adnexa: see above.                                       Rectovaginal:  Yes.                                        Confirms above.  Mass extending into the cul de sac region, nontender.                                       Anus:  normal sphincter tone, no lesions  ASSESSMENT  Uterine fibroids.  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 Mild stress incontinence.  Due for mammogram.   PLAN  Mammogram 07/09/14.  Proceed with total abdominal hysterectomy with bilateral salpingo-oophorectomy, collection of pelvic washings.  Discussed benefits and risks of procedure including but not limited to bleeding, infection, damage to surrounding organs, reaction to anesthesia, pneumonia, death, DVT, PE,  neuropathy, incisional hernia, and need for operation.   She understands that if an occult malignancy were discovered, she may need additional staging through a separate surgery.  Surgical recovery discussed.  Will not proceed with an incontinence procedure at this time.   An After Visit Summary was printed and given to the patient.  25 minutes face to face time of which over 50% was spent in counseling.

## 2014-07-09 ENCOUNTER — Other Ambulatory Visit: Payer: Self-pay

## 2014-07-09 ENCOUNTER — Encounter (HOSPITAL_COMMUNITY)
Admission: RE | Admit: 2014-07-09 | Discharge: 2014-07-09 | Disposition: A | Discharge status: Home / Self Care | Payer: BC Managed Care – PPO | Source: Ambulatory Visit | Attending: Obstetrics and Gynecology | Admitting: Obstetrics and Gynecology

## 2014-07-09 ENCOUNTER — Encounter (HOSPITAL_COMMUNITY): Payer: Self-pay

## 2014-07-09 ENCOUNTER — Ambulatory Visit
Admission: RE | Admit: 2014-07-09 | Discharge: 2014-07-09 | Disposition: A | Discharge status: Home / Self Care | Payer: BC Managed Care – PPO | Source: Ambulatory Visit | Attending: Obstetrics and Gynecology | Admitting: Obstetrics and Gynecology

## 2014-07-09 DIAGNOSIS — Z01818 Encounter for other preprocedural examination: Secondary | ICD-10-CM | POA: Insufficient documentation

## 2014-07-09 DIAGNOSIS — Z1231 Encounter for screening mammogram for malignant neoplasm of breast: Secondary | ICD-10-CM

## 2014-07-09 HISTORY — DX: Headache: R51

## 2014-07-09 HISTORY — DX: Fibromyalgia: M79.7

## 2014-07-09 HISTORY — DX: Headache, unspecified: R51.9

## 2014-07-09 LAB — CBC
HCT: 42.5 % (ref 36.0–46.0)
Hemoglobin: 15 g/dL (ref 12.0–15.0)
MCH: 30.4 pg (ref 26.0–34.0)
MCHC: 35.3 g/dL (ref 30.0–36.0)
MCV: 86 fL (ref 78.0–100.0)
PLATELETS: 260 10*3/uL (ref 150–400)
RBC: 4.94 MIL/uL (ref 3.87–5.11)
RDW: 12.6 % (ref 11.5–15.5)
WBC: 14.3 10*3/uL — ABNORMAL HIGH (ref 4.0–10.5)

## 2014-07-09 NOTE — Pre-Procedure Instructions (Signed)
Pt had various high BP readings in both arms during PAT appt. Pt stated she was in pain, also nervous, and had eaten ham over the holiday. She noted that her rings were tight. Her BP readings ranged from 130/96 to 150/100. I advised pt to get her BP taken in a few days and report it to Dr. Quincy Simmonds and her PMD if reading was higher than her normal. She stated her BP was 130/80 2 weeks ago. She is not being treated for HTN.

## 2014-07-09 NOTE — Patient Instructions (Signed)
Your procedure is scheduled on:07/17/14  Enter through the Main Entrance at : [facility] up desk phone and dial [phone] and inform us of your arrival.  Please call [phone] if you have any problems the morning of surgery.  Remember: Do not eat food or drink liquids, including water, after midnight:Monday 07/16/14   You may brush your teeth the morning of surgery.    DO NOT wear jewelry, eye make-up, lipstick,body lotion, or dark fingernail polish.  (Polished toes are ok) You may wear deodorant.  If you are to be admitted after surgery, leave suitcase in car until your room has been assigned. Patients discharged on the day of surgery will not be allowed to drive home. Wear loose fitting, comfortable clothes for your ride home.

## 2014-07-16 ENCOUNTER — Telehealth: Payer: Self-pay | Admitting: Obstetrics and Gynecology

## 2014-07-16 ENCOUNTER — Encounter (HOSPITAL_COMMUNITY): Payer: Self-pay | Admitting: Anesthesiology

## 2014-07-16 MED ORDER — DEXTROSE 5 % IV SOLN
2.0000 g | INTRAVENOUS | Status: AC
  Administered 2014-07-17: 2 g via INTRAVENOUS
  Filled 2014-07-16: qty 2

## 2014-07-16 NOTE — Anesthesia Preprocedure Evaluation (Addendum)
Anesthesia Evaluation  Patient identified by MRN, date of birth, ID band Patient awake    Reviewed: Allergy & Precautions, NPO status , Patient's Chart, lab work & pertinent test results  History of Anesthesia Complications Negative for: history of anesthetic complications  Airway Mallampati: III  TM Distance: >3 FB Neck ROM: Full    Dental no notable dental hx. (+) Dental Advisory Given   Pulmonary former smoker,  breath sounds clear to auscultation  Pulmonary exam normal       Cardiovascular Exercise Tolerance: Good negative cardio ROS  Rhythm:Regular Rate:Normal     Neuro/Psych  Headaches, PSYCHIATRIC DISORDERS Anxiety    GI/Hepatic negative GI ROS, Neg liver ROS,   Endo/Other  Obesity  Renal/GU negative Renal ROS     Musculoskeletal  (+) Arthritis -, Osteoarthritis,  Fibromyalgia -  Abdominal   Peds  Hematology  (+) Blood dyscrasia, ,   Anesthesia Other Findings   Reproductive/Obstetrics Bilateral complex ovarian cysts Uterine Fibroids                            Anesthesia Physical Anesthesia Plan  ASA: II  Anesthesia Plan: General   Post-op Pain Management:    Induction: Intravenous  Airway Management Planned: Oral ETT  Additional Equipment:   Intra-op Plan:   Post-operative Plan: Extubation in OR  Informed Consent: I have reviewed the patients History and Physical, chart, labs and discussed the procedure including the risks, benefits and alternatives for the proposed anesthesia with the patient or authorized representative who has indicated his/her understanding and acceptance.   Dental advisory given  Plan Discussed with: CRNA, Anesthesiologist and Surgeon  Anesthesia Plan Comments:         Anesthesia Quick Evaluation

## 2014-07-16 NOTE — Telephone Encounter (Signed)
Patient wants to speak with the billing department.

## 2014-07-16 NOTE — H&P (Signed)
Denise Thorman E Amundson de Berton Lan, MD at 06/28/2014  4:01 PM       Status: Signed        Expand All Collapse All   Patient ID: Windell Hummingbird, female   DOB: 08-Jul-1956, 59 y.o.   MRN: [id number]  GYNECOLOGY VISIT  PCP:   Referring provider:   HPI: 59 y.o.     Caucasian  female    G1P1000 with Patient's last menstrual period was 05/13/2006.   here for surgical consultation.   Patient has a pelvic mass found on routine pelvic exam.   Patient is menopausal and has no postmenopausal bleeding.   She is not on any HRT.    States she does not have any pain.   Just feels fatigued.   Can leak urine if coughs really hard.   Some night time urination.   Not sexually active for a long time.   Hx of elevated WBC and sed rate.   Saw Dr. Alvy Bimler at [facility].  Pelvic ultrasound 05/24/14 -   Uterus with 3 intramural fibroids, largest 3.7 cm. EMS distorted and measuring 4.95 mm. Complex ovarian cysts - right 12.3 cm in largest diameter and left 8.5 cm in largest diameter.  Both mutiloculated with smooth borders and no abnormal flow. No free fluid.   CA125 - 16 CEA - 2.3  Saw Dr. Adine Madura of GYN/ONC and was told risk of malignancy is low.   Can leak urine when lying in bed at night and coughs or sneezes.   No leak if standing and coughing or sneezing.   GYNECOLOGIC HISTORY: Patient's last menstrual period was 05/13/2006. Sexually active:  no    Contraception:  NA   Menopausal hormone therapy: no DES exposure:   No Blood transfusions:   no Sexually transmitted diseases:   HPV GYN procedures and prior surgeries:  Colposcopy.   Last mammogram:   2011.     Last pap and high risk HPV testing:   05/21/14 - normal, negative HR HPV History of abnormal pap smear:  Yes. 1985.  No treatment.      OB History      Gravida  Para  Term  Preterm  AB  TAB  SAB  Ectopic  Multiple  Living     1  1  1                         Family History   Problem  Relation  Age of Onset    .  Breast cancer  Mother  70   .  Alzheimer's disease  Father  27   .  Breast cancer  Maternal Grandmother  70       Patient Active Problem List     Diagnosis  Date Noted   .  Fibroids, intramural  05/24/2014   .  Bilateral ovarian cysts  05/24/2014   .  Leukocytosis  05/17/2014   .  Morbid obesity  05/17/2014   .  Chronic fatigue  05/17/2014   .  Chronic pain of multiple joints  05/17/2014    Past Medical History   Diagnosis  Date   .  Arthritis     .  Leukocytosis     .  Scarlet fever     .  Leukocytosis  05/17/2014   .  Abnormal Pap smear of cervix  ~1986   .  History of mumps as a child  Past Surgical History   Procedure  Laterality  Date   .  Tonsillectomy       .  Colposcopy    ~1986       Normal     ALLERGIES: Doxycycline    Current Outpatient Prescriptions   Medication  Sig  Dispense  Refill   .  acetaminophen (TYLENOL) 325 MG tablet  Take 650 mg by mouth every 6 (six) hours as needed.       Marland Kitchen  ibuprofen (ADVIL,MOTRIN) 200 MG tablet  Take 200 mg by mouth every 6 (six) hours as needed.       .  Multiple Vitamin (MULTIVITAMIN) tablet  Take 1 tablet by mouth daily.       .  cyclobenzaprine (FLEXERIL) 10 MG tablet  Take 10 mg by mouth at bedtime as needed for muscle spasms.          No current facility-administered medications for this visit.      ROS:  Pertinent items are noted in HPI.    History      Social History   .  Marital Status:  Unknown       Spouse Name:  N/A       Number of Children:  N/A   .  Years of Education:  N/A      Occupational History   .  Not on file.      Social History Main Topics   .  Smoking status:  Former Smoker -- 0.50 packs/day for 36 years       Quit date:  05/17/2010   .  Smokeless tobacco:  Never Used   .  Alcohol Use:  No   .  Drug Use:  No   .  Sexual Activity:  Not Currently       Birth Control/ Protection:  Post-menopausal      Other Topics  Concern   .  Not on file      Social History Narrative      PHYSICAL EXAMINATION:    BP 134/90 mmHg  Pulse 84  Resp 16  Ht 5' 1.5" (1.562 m)  Wt   LMP 05/13/2006   Wt Readings from Last 3 Encounters:   06/11/14  198 lb 12.8 oz (90.175 kg)   05/24/14  198 lb (89.812 kg)   05/21/14  198 lb (89.812 kg)       Ht Readings from Last 3 Encounters:   06/28/14  5' 1.5" (1.562 m)   06/11/14  5' 1.5" (1.562 m)   05/24/14  5' 1.5" (1.562 m)     General appearance: alert, cooperative and appears stated age Head: Normocephalic, without obvious abnormality, atraumatic Neck: no adenopathy, supple, symmetrical, trachea midline and thyroid not enlarged, symmetric, no tenderness/mass/nodules Lungs: clear to auscultation bilaterally Heart: regular rate and rhythm Abdomen: soft, non-tender; mass to a few cm below umbilicus, nontender. Extremities: extremities normal, atraumatic, no cyanosis or edema Skin: Skin color, texture, turgor normal. No rashes or lesions Lymph nodes: Cervical, supraclavicular, and axillary nodes normal. No abnormal inguinal nodes palpated Neurologic: Grossly normal  Pelvic: External genitalia:  no lesions              Urethra:  normal appearing urethra with no masses, tenderness or lesions              Bartholins and Skenes: normal                  Vagina: normal appearing vagina with  normal color and discharge, no lesions              Cervix: normal appearance                   Bimanual Exam:  Uterus:  uterus is 16 - 17 week size and nontender.  Uterus not palpated separately from ovaries.                                        Adnexa: see above.                                        Rectovaginal:  Yes.                                        Confirms above.  Mass extending into the cul de sac region, nontender.                                        Anus:  normal sphincter tone, no lesions  ASSESSMENT  Uterine fibroids.   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 Mild stress  incontinence.   Due for mammogram.   PLAN  Mammogram 07/09/14.   Proceed with total abdominal hysterectomy with bilateral salpingo-oophorectomy, collection of pelvic washings.  Discussed benefits and risks of procedure including but not limited to bleeding, infection, damage to surrounding organs, reaction to anesthesia, pneumonia, death, DVT, PE, neuropathy, incisional hernia, and need for operation.    She understands that if an occult malignancy were discovered, she may need additional staging through a separate surgery.   Surgical recovery discussed.   Will not proceed with an incontinence procedure at this time.   An After Visit Summary was printed and given to the patient.  25 minutes face to face time of which over 50% was spent in counseling.

## 2014-07-17 ENCOUNTER — Encounter (HOSPITAL_COMMUNITY)
Admission: RE | Disposition: A | Discharge status: Home / Self Care | Payer: Self-pay | Source: Ambulatory Visit | Attending: Obstetrics and Gynecology

## 2014-07-17 ENCOUNTER — Inpatient Hospital Stay (HOSPITAL_COMMUNITY)
Admission: RE | Admit: 2014-07-17 | Discharge: 2014-07-19 | DRG: 743 | Disposition: A | Discharge status: Home / Self Care | Payer: BC Managed Care – PPO | Source: Ambulatory Visit | Attending: Obstetrics and Gynecology | Admitting: Obstetrics and Gynecology

## 2014-07-17 ENCOUNTER — Inpatient Hospital Stay (HOSPITAL_COMMUNITY): Payer: BC Managed Care – PPO | Admitting: Anesthesiology

## 2014-07-17 ENCOUNTER — Encounter (HOSPITAL_COMMUNITY): Payer: Self-pay | Admitting: *Deleted

## 2014-07-17 DIAGNOSIS — Z6836 Body mass index (BMI) 36.0-36.9, adult: Secondary | ICD-10-CM | POA: Diagnosis not present

## 2014-07-17 DIAGNOSIS — N393 Stress incontinence (female) (male): Secondary | ICD-10-CM | POA: Diagnosis present

## 2014-07-17 DIAGNOSIS — N8320 Unspecified ovarian cysts: Secondary | ICD-10-CM | POA: Diagnosis present

## 2014-07-17 DIAGNOSIS — Z90710 Acquired absence of both cervix and uterus: Secondary | ICD-10-CM | POA: Diagnosis present

## 2014-07-17 DIAGNOSIS — Z881 Allergy status to other antibiotic agents status: Secondary | ICD-10-CM | POA: Diagnosis not present

## 2014-07-17 DIAGNOSIS — M1990 Unspecified osteoarthritis, unspecified site: Secondary | ICD-10-CM | POA: Diagnosis present

## 2014-07-17 DIAGNOSIS — G8929 Other chronic pain: Secondary | ICD-10-CM | POA: Diagnosis present

## 2014-07-17 DIAGNOSIS — B977 Papillomavirus as the cause of diseases classified elsewhere: Secondary | ICD-10-CM | POA: Diagnosis present

## 2014-07-17 DIAGNOSIS — D259 Leiomyoma of uterus, unspecified: Secondary | ICD-10-CM | POA: Diagnosis present

## 2014-07-17 HISTORY — PX: ABDOMINAL HYSTERECTOMY: SHX81

## 2014-07-17 HISTORY — PX: SALPINGOOPHORECTOMY: SHX82

## 2014-07-17 LAB — COMPREHENSIVE METABOLIC PANEL
ALK PHOS: 123 U/L — AB (ref 39–117)
ALT: 39 U/L — AB (ref 0–35)
AST: 38 U/L — AB (ref 0–37)
Albumin: 4.1 g/dL (ref 3.5–5.2)
Anion gap: 8 (ref 5–15)
BUN: 13 mg/dL (ref 6–23)
CHLORIDE: 106 meq/L (ref 96–112)
CO2: 26 mmol/L (ref 19–32)
CREATININE: 0.69 mg/dL (ref 0.50–1.10)
Calcium: 9.2 mg/dL (ref 8.4–10.5)
GFR calc Af Amer: >90 mL/min (ref >90)
GFR calc non Af Amer: >90 mL/min (ref >90)
Glucose, Bld: 117 mg/dL — ABNORMAL HIGH (ref 70–99)
Potassium: 3.4 mmol/L — ABNORMAL LOW (ref 3.5–5.1)
SODIUM: 140 mmol/L (ref 135–145)
Total Bilirubin: 0.6 mg/dL (ref 0.3–1.2)
Total Protein: 7.9 g/dL (ref 6.0–8.3)

## 2014-07-17 SURGERY — HYSTERECTOMY, ABDOMINAL
Anesthesia: General

## 2014-07-17 MED ORDER — MIDAZOLAM HCL 2 MG/2ML IJ SOLN
INTRAMUSCULAR | Status: AC
  Filled 2014-07-17: qty 2

## 2014-07-17 MED ORDER — HYDROMORPHONE HCL 1 MG/ML IJ SOLN
INTRAMUSCULAR | Status: AC
  Filled 2014-07-17: qty 1

## 2014-07-17 MED ORDER — MIDAZOLAM HCL 2 MG/2ML IJ SOLN
INTRAMUSCULAR | Status: DC | PRN
  Administered 2014-07-17: 2 mg via INTRAVENOUS

## 2014-07-17 MED ORDER — GLYCOPYRROLATE 0.2 MG/ML IJ SOLN
INTRAMUSCULAR | Status: DC | PRN
  Administered 2014-07-17: 0.6 mg via INTRAVENOUS
  Administered 2014-07-17: 0.1 mg via INTRAVENOUS

## 2014-07-17 MED ORDER — HYDROMORPHONE HCL 1 MG/ML IJ SOLN
INTRAMUSCULAR | Status: DC | PRN
  Administered 2014-07-17: 0.5 mg via INTRAVENOUS
  Administered 2014-07-17: 1 mg via INTRAVENOUS
  Administered 2014-07-17: 0.5 mg via INTRAVENOUS

## 2014-07-17 MED ORDER — GLYCOPYRROLATE 0.2 MG/ML IJ SOLN
INTRAMUSCULAR | Status: AC
  Filled 2014-07-17: qty 4

## 2014-07-17 MED ORDER — ONDANSETRON HCL 4 MG/2ML IJ SOLN
INTRAMUSCULAR | Status: AC
  Filled 2014-07-17: qty 2

## 2014-07-17 MED ORDER — PHENYLEPHRINE 40 MCG/ML (10ML) SYRINGE FOR IV PUSH (FOR BLOOD PRESSURE SUPPORT)
PREFILLED_SYRINGE | INTRAVENOUS | Status: AC
  Filled 2014-07-17: qty 5

## 2014-07-17 MED ORDER — PHENYLEPHRINE HCL 10 MG/ML IJ SOLN
INTRAMUSCULAR | Status: DC | PRN
  Administered 2014-07-17 (×4): 40 ug via INTRAVENOUS
  Administered 2014-07-17: 80 ug via INTRAVENOUS

## 2014-07-17 MED ORDER — MEPERIDINE HCL 25 MG/ML IJ SOLN: 6.2500 mg | INTRAMUSCULAR | Status: DC | PRN

## 2014-07-17 MED ORDER — SODIUM CHLORIDE 0.9 % IJ SOLN
INTRAMUSCULAR | Status: DC | PRN
  Administered 2014-07-17: 30 mL

## 2014-07-17 MED ORDER — DEXAMETHASONE SODIUM PHOSPHATE 10 MG/ML IJ SOLN
INTRAMUSCULAR | Status: DC | PRN
  Administered 2014-07-17: 4 mg via INTRAVENOUS

## 2014-07-17 MED ORDER — BUPIVACAINE LIPOSOME 1.3 % IJ SUSP
20.0000 mL | Freq: Once | INTRAMUSCULAR | Status: AC
Start: 2014-07-17 — End: 2014-07-17
  Administered 2014-07-17: 20 mL
  Filled 2014-07-17: qty 20

## 2014-07-17 MED ORDER — ROCURONIUM BROMIDE 100 MG/10ML IV SOLN
INTRAVENOUS | Status: AC
  Filled 2014-07-17: qty 1

## 2014-07-17 MED ORDER — FENTANYL CITRATE 0.05 MG/ML IJ SOLN: 25.0000 ug | INTRAMUSCULAR | Status: DC | PRN

## 2014-07-17 MED ORDER — LACTATED RINGERS IV SOLN
INTRAVENOUS | Status: DC
  Administered 2014-07-17 – 2014-07-18 (×3): via INTRAVENOUS

## 2014-07-17 MED ORDER — HEPARIN SODIUM (PORCINE) 5000 UNIT/ML IJ SOLN
INTRAMUSCULAR | Status: AC
  Filled 2014-07-17: qty 1

## 2014-07-17 MED ORDER — SCOPOLAMINE 1 MG/3DAYS TD PT72
MEDICATED_PATCH | TRANSDERMAL | Status: DC
Start: 2014-07-17 — End: 2014-07-19
  Administered 2014-07-17: 1.5 mg via TRANSDERMAL
  Filled 2014-07-17: qty 1

## 2014-07-17 MED ORDER — ONDANSETRON HCL 4 MG/2ML IJ SOLN
INTRAMUSCULAR | Status: DC | PRN
  Administered 2014-07-17: 4 mg via INTRAVENOUS

## 2014-07-17 MED ORDER — SODIUM CHLORIDE 0.9 % IJ SOLN: 9.0000 mL | INTRAMUSCULAR | Status: DC | PRN

## 2014-07-17 MED ORDER — OXYCODONE-ACETAMINOPHEN 5-325 MG PO TABS
1.0000 | ORAL_TABLET | ORAL | Status: DC | PRN
  Administered 2014-07-18 – 2014-07-19 (×4): 2 via ORAL
  Filled 2014-07-17 (×4): qty 2

## 2014-07-17 MED ORDER — DEXAMETHASONE SODIUM PHOSPHATE 10 MG/ML IJ SOLN
INTRAMUSCULAR | Status: AC
  Filled 2014-07-17: qty 1

## 2014-07-17 MED ORDER — ONDANSETRON HCL 4 MG/2ML IJ SOLN: 4.0000 mg | Freq: Four times a day (QID) | INTRAMUSCULAR | Status: DC | PRN

## 2014-07-17 MED ORDER — FENTANYL CITRATE 0.05 MG/ML IJ SOLN
INTRAMUSCULAR | Status: DC | PRN
  Administered 2014-07-17: 50 ug via INTRAVENOUS
  Administered 2014-07-17: 100 ug via INTRAVENOUS
  Administered 2014-07-17 (×2): 50 ug via INTRAVENOUS

## 2014-07-17 MED ORDER — PROPOFOL 10 MG/ML IV BOLUS
INTRAVENOUS | Status: DC | PRN
  Administered 2014-07-17: 180 mg via INTRAVENOUS

## 2014-07-17 MED ORDER — IBUPROFEN 600 MG PO TABS
600.0000 mg | ORAL_TABLET | Freq: Four times a day (QID) | ORAL | Status: DC | PRN
  Administered 2014-07-18 – 2014-07-19 (×2): 600 mg via ORAL
  Filled 2014-07-17 (×2): qty 1

## 2014-07-17 MED ORDER — ROCURONIUM BROMIDE 100 MG/10ML IV SOLN
INTRAVENOUS | Status: DC | PRN
  Administered 2014-07-17: 10 mg via INTRAVENOUS
  Administered 2014-07-17: 35 mg via INTRAVENOUS
  Administered 2014-07-17: 5 mg via INTRAVENOUS
  Administered 2014-07-17 (×2): 10 mg via INTRAVENOUS

## 2014-07-17 MED ORDER — HYDROMORPHONE HCL 1 MG/ML IJ SOLN
0.2500 mg | INTRAMUSCULAR | Status: DC | PRN
  Administered 2014-07-17 (×4): 0.5 mg via INTRAVENOUS

## 2014-07-17 MED ORDER — NALOXONE HCL 0.4 MG/ML IJ SOLN: 0.4000 mg | INTRAMUSCULAR | Status: DC | PRN

## 2014-07-17 MED ORDER — SODIUM CHLORIDE 0.9 % IJ SOLN
INTRAMUSCULAR | Status: AC
  Filled 2014-07-17: qty 50

## 2014-07-17 MED ORDER — KETOROLAC TROMETHAMINE 30 MG/ML IJ SOLN
INTRAMUSCULAR | Status: AC
  Filled 2014-07-17: qty 1

## 2014-07-17 MED ORDER — LIDOCAINE HCL (CARDIAC) 20 MG/ML IV SOLN
INTRAVENOUS | Status: DC | PRN
  Administered 2014-07-17: 80 mg via INTRAVENOUS

## 2014-07-17 MED ORDER — DIPHENHYDRAMINE HCL 50 MG/ML IJ SOLN: 12.5000 mg | Freq: Four times a day (QID) | INTRAMUSCULAR | Status: DC | PRN

## 2014-07-17 MED ORDER — DIPHENHYDRAMINE HCL 12.5 MG/5ML PO ELIX: 12.5000 mg | ORAL_SOLUTION | Freq: Four times a day (QID) | ORAL | Status: DC | PRN

## 2014-07-17 MED ORDER — LACTATED RINGERS IV SOLN
INTRAVENOUS | Status: DC
  Administered 2014-07-17 (×3): via INTRAVENOUS

## 2014-07-17 MED ORDER — PROPOFOL 10 MG/ML IV EMUL
INTRAVENOUS | Status: AC
  Filled 2014-07-17: qty 20

## 2014-07-17 MED ORDER — MORPHINE SULFATE (PF) 1 MG/ML IV SOLN
INTRAVENOUS | Status: DC
  Administered 2014-07-17: 6 mg via INTRAVENOUS
  Administered 2014-07-17: 2 mg via INTRAVENOUS
  Administered 2014-07-17: 13:00:00 via INTRAVENOUS
  Administered 2014-07-18: 4 mg via INTRAVENOUS
  Filled 2014-07-17: qty 25

## 2014-07-17 MED ORDER — KETOROLAC TROMETHAMINE 30 MG/ML IJ SOLN
30.0000 mg | Freq: Four times a day (QID) | INTRAMUSCULAR | Status: DC
  Administered 2014-07-17 – 2014-07-18 (×4): 30 mg via INTRAVENOUS
  Filled 2014-07-17 (×3): qty 1

## 2014-07-17 MED ORDER — LACTATED RINGERS IV BOLUS (SEPSIS)
500.0000 mL | Freq: Once | INTRAVENOUS | Status: AC
  Administered 2014-07-17: 500 mL via INTRAVENOUS

## 2014-07-17 MED ORDER — METOCLOPRAMIDE HCL 5 MG/ML IJ SOLN: 10.0000 mg | Freq: Once | INTRAMUSCULAR | Status: DC | PRN

## 2014-07-17 MED ORDER — NEOSTIGMINE METHYLSULFATE 10 MG/10ML IV SOLN
INTRAVENOUS | Status: DC | PRN
  Administered 2014-07-17: 4 mg via INTRAVENOUS

## 2014-07-17 MED ORDER — SCOPOLAMINE 1 MG/3DAYS TD PT72
1.0000 | MEDICATED_PATCH | Freq: Once | TRANSDERMAL | Status: DC
  Administered 2014-07-17: 1.5 mg via TRANSDERMAL

## 2014-07-17 MED ORDER — MENTHOL 3 MG MT LOZG: 1.0000 | LOZENGE | OROMUCOSAL | Status: DC | PRN

## 2014-07-17 MED ORDER — FENTANYL CITRATE 0.05 MG/ML IJ SOLN
INTRAMUSCULAR | Status: AC
  Filled 2014-07-17: qty 5

## 2014-07-17 MED ORDER — LIDOCAINE HCL (CARDIAC) 20 MG/ML IV SOLN
INTRAVENOUS | Status: AC
Start: 2014-07-17 — End: 2014-07-17
  Filled 2014-07-17: qty 5

## 2014-07-17 MED ORDER — DOCUSATE SODIUM 100 MG PO CAPS
100.0000 mg | ORAL_CAPSULE | Freq: Two times a day (BID) | ORAL | Status: DC
  Administered 2014-07-18: 100 mg via ORAL
  Filled 2014-07-17 (×2): qty 1

## 2014-07-17 MED ORDER — HEPARIN SODIUM (PORCINE) 5000 UNIT/ML IJ SOLN
INTRAMUSCULAR | Status: DC | PRN
  Administered 2014-07-17: 5000 [IU]

## 2014-07-17 MED ORDER — ONDANSETRON HCL 4 MG PO TABS: 4.0000 mg | ORAL_TABLET | Freq: Four times a day (QID) | ORAL | Status: DC | PRN

## 2014-07-17 MED ORDER — NEOSTIGMINE METHYLSULFATE 10 MG/10ML IV SOLN
INTRAVENOUS | Status: AC
  Filled 2014-07-17: qty 1

## 2014-07-17 SURGICAL SUPPLY — 41 items
BENZOIN TINCTURE PRP APPL 2/3 (GAUZE/BANDAGES/DRESSINGS) ×4 IMPLANT
CANISTER SUCT 3000ML (MISCELLANEOUS) ×4 IMPLANT
CATH FOLEY 3WAY  5CC 16FR (CATHETERS)
CATH FOLEY 3WAY 5CC 16FR (CATHETERS) IMPLANT
CLOSURE WOUND 1/2 X4 (GAUZE/BANDAGES/DRESSINGS) ×1
CLOTH BEACON ORANGE TIMEOUT ST (SAFETY) ×4 IMPLANT
DECANTER SPIKE VIAL GLASS SM (MISCELLANEOUS) ×4 IMPLANT
DRAPE WARM FLUID 44X44 (DRAPE) ×4 IMPLANT
DRSG OPSITE POSTOP 4X10 (GAUZE/BANDAGES/DRESSINGS) ×4 IMPLANT
DURAPREP 26ML APPLICATOR (WOUND CARE) ×4 IMPLANT
GAUZE SPONGE 4X4 16PLY XRAY LF (GAUZE/BANDAGES/DRESSINGS) ×4 IMPLANT
GLOVE BIO SURGEON STRL SZ 6.5 (GLOVE) ×3 IMPLANT
GLOVE BIO SURGEONS STRL SZ 6.5 (GLOVE) ×1
GLOVE BIOGEL PI IND STRL 7.0 (GLOVE) ×4 IMPLANT
GLOVE BIOGEL PI INDICATOR 7.0 (GLOVE) ×4
GOWN STRL REUS W/TWL LRG LVL3 (GOWN DISPOSABLE) ×12 IMPLANT
NEEDLE HYPO 25X1 1.5 SAFETY (NEEDLE) ×4 IMPLANT
NS IRRIG 1000ML POUR BTL (IV SOLUTION) ×4 IMPLANT
PACK ABDOMINAL GYN (CUSTOM PROCEDURE TRAY) ×4 IMPLANT
PAD OB MATERNITY 4.3X12.25 (PERSONAL CARE ITEMS) ×4 IMPLANT
PROTECTOR NERVE ULNAR (MISCELLANEOUS) ×4 IMPLANT
SET CYSTO W/LG BORE CLAMP LF (SET/KITS/TRAYS/PACK) IMPLANT
SHEET LAVH (DRAPES) ×4 IMPLANT
SPONGE LAP 18X18 X RAY DECT (DISPOSABLE) ×16 IMPLANT
SPONGE SURGIFOAM ABS GEL 12-7 (HEMOSTASIS) ×4 IMPLANT
STAPLER VISISTAT 35W (STAPLE) ×4 IMPLANT
STRIP CLOSURE SKIN 1/2X4 (GAUZE/BANDAGES/DRESSINGS) ×3 IMPLANT
SUT PDS AB 0 CTX 36 PDP370T (SUTURE) ×8 IMPLANT
SUT PLAIN 2 0 XLH (SUTURE) ×4 IMPLANT
SUT VIC AB 0 CT1 18XCR BRD8 (SUTURE) ×6 IMPLANT
SUT VIC AB 0 CT1 27 (SUTURE) ×6
SUT VIC AB 0 CT1 27XBRD ANBCTR (SUTURE) ×6 IMPLANT
SUT VIC AB 0 CT1 8-18 (SUTURE) ×6
SUT VIC AB 2-0 CT1 27 (SUTURE) ×2
SUT VIC AB 2-0 CT1 TAPERPNT 27 (SUTURE) ×2 IMPLANT
SUT VIC AB 3-0 PS2 18 (SUTURE) ×4 IMPLANT
SUT VIC AB 4-0 PS2 27 (SUTURE) ×4 IMPLANT
SUT VICRYL 0 TIES 12 18 (SUTURE) ×4 IMPLANT
SYR CONTROL 10ML LL (SYRINGE) ×4 IMPLANT
TOWEL OR 17X24 6PK STRL BLUE (TOWEL DISPOSABLE) ×8 IMPLANT
TRAY FOLEY BAG SILVER LF 16FR (CATHETERS) ×4 IMPLANT

## 2014-07-17 NOTE — Progress Notes (Signed)
Update to History and Physical  No marked change in status.  Patient examined.   OK to proceed.

## 2014-07-17 NOTE — Addendum Note (Signed)
Addendum  created 07/17/14 1420 by Raenette Rover, CRNA   Modules edited: Notes Section   Notes Section:  File: [id number]

## 2014-07-17 NOTE — Brief Op Note (Signed)
07/17/2014  10:37 AM  PATIENT:  Denise Munoz  59 y.o. female  PRE-OPERATIVE DIAGNOSIS:  bilateral complex ovarian cysts, uterine fibroids  POST-OPERATIVE DIAGNOSIS:  bilateral complex ovarian cysts, uterine fibroids  PROCEDURE:  Procedure(s): HYSTERECTOMY ABDOMINAL (N/A) SALPINGO OOPHORECTOMY with collection of pelvic washings (Bilateral)  SURGEON:  Surgeon(s) and Role:    * Zylon Creamer E Amundson de Berton Lan, MD - Primary    * Lyman Speller, MD - Assisting  PHYSICIAN ASSISTANT: NA  ASSISTANTS:  Lyman Speller, MD   ANESTHESIA:   local and general  EBL:  Total I/O In: 2000 [I.V.:2000] Out: 225 [Urine:75; Blood:150]  BLOOD ADMINISTERED:none  DRAINS: Urinary Catheter (Foley)   LOCAL MEDICATIONS USED:  OTHER  Exparel   SPECIMEN:  Source of Specimen:   Uterus, cervix, bilateral tubes and ovaries; pelvic washings  DISPOSITION OF SPECIMEN:  PATHOLOGY  COUNTS:  YES  TOURNIQUET:  * No tourniquets in log *  DICTATION: .Other Dictation: Dictation Number    PLAN OF CARE: Admit to inpatient   PATIENT DISPOSITION:  PACU - hemodynamically stable.   Delay start of Pharmacological VTE agent (>24hrs) due to surgical blood loss or risk of bleeding: not applicable

## 2014-07-17 NOTE — Transfer of Care (Signed)
Immediate Anesthesia Transfer of Care Note  Patient: Denise Munoz  Procedure(s) Performed: Procedure(s): HYSTERECTOMY ABDOMINAL (N/A) SALPINGO OOPHORECTOMY with collection of pelvic washings (Bilateral)  Patient Location: PACU  Anesthesia Type:General  Level of Consciousness: awake, alert  and oriented  Airway & Oxygen Therapy: Patient Spontanous Breathing and Patient connected to face mask oxygen  Post-op Assessment: Report given to PACU RN and Post -op Vital signs reviewed and stable  Post vital signs: Reviewed and stable  Complications: No apparent anesthesia complications

## 2014-07-17 NOTE — Anesthesia Procedure Notes (Signed)
Procedure Name: Intubation Date/Time: 07/17/2014 7:41 AM Performed by: Flossie Dibble Pre-anesthesia Checklist: Emergency Drugs available, Timeout performed, Suction available, Patient identified and Patient being monitored Patient Re-evaluated:Patient Re-evaluated prior to inductionOxygen Delivery Method: Circle system utilized Preoxygenation: Pre-oxygenation with 100% oxygen Intubation Type: IV induction Ventilation: Two handed mask ventilation required Tube size: 7.0 mm Number of attempts: 1 Airway Equipment and Method: Video-laryngoscopy and Patient positioned with wedge pillow Placement Confirmation: ETT inserted through vocal cords under direct vision,  breath sounds checked- equal and bilateral and positive ETCO2 Secured at: 21 cm Tube secured with: Tape Dental Injury: Teeth and Oropharynx as per pre-operative assessment  Difficulty Due To: Difficulty was anticipated, Difficult Airway- due to anterior larynx and Difficult Airway- due to limited oral opening

## 2014-07-17 NOTE — Anesthesia Postprocedure Evaluation (Signed)
  Anesthesia Post-op Note  Patient: Denise Munoz  Procedure(s) Performed: Procedure(s): HYSTERECTOMY ABDOMINAL (N/A) SALPINGO OOPHORECTOMY with collection of pelvic washings (Bilateral)  Patient Location: PACU  Anesthesia Type:General  Level of Consciousness: awake, alert  and oriented  Airway and Oxygen Therapy: Patient Spontanous Breathing  Post-op Pain: mild  Post-op Assessment: Post-op Vital signs reviewed, Patient's Cardiovascular Status Stable, Respiratory Function Stable, Patent Airway, No signs of Nausea or vomiting and Pain level controlled  Post-op Vital Signs: Reviewed and stable  Last Vitals:  Filed Vitals:   07/17/14 1215  BP: 93/66  Pulse: 103  Temp:   Resp: 12    Complications: No apparent anesthesia complications

## 2014-07-17 NOTE — Anesthesia Postprocedure Evaluation (Signed)
  Anesthesia Post-op Note  Patient: Denise Munoz  Procedure(s) Performed: Procedure(s): HYSTERECTOMY ABDOMINAL (N/A) SALPINGO OOPHORECTOMY with collection of pelvic washings (Bilateral)  Patient Location: [facility]  Anesthesia Type:General  Level of Consciousness: awake, alert , oriented and patient cooperative  Airway and Oxygen Therapy: Patient Spontanous Breathing and Patient connected to nasal cannula oxygen  Post-op Pain: mild  Post-op Assessment: Post-op Vital signs reviewed, Patient's Cardiovascular Status Stable, Respiratory Function Stable, Patent Airway and No signs of Nausea or vomiting  Post-op Vital Signs: Reviewed and stable  Last Vitals:  Filed Vitals:   07/17/14 1413  BP: 109/87  Pulse: 98  Temp:   Resp: 16    Complications: No apparent anesthesia complications

## 2014-07-17 NOTE — Progress Notes (Signed)
Day of Surgery Procedure(s) (LRB): HYSTERECTOMY ABDOMINAL (N/A) SALPINGO OOPHORECTOMY with collection of pelvic washings (Bilateral)  Subjective: Patient reports thirsty.  No nausea.  Good pain control.    Objective: I have reviewed patient's vital signs and intake and output. T 98.9, BP 127/68, P 79, RR 16  I/O -  3293.8/475 cc  General: alert and cooperative Resp:  rales in the right base, otherwise CTA bilaterally. Cardio: regular rate and rhythm, S1, S2 normal, no murmur, click, rub or gallop GI: soft, non-tender; bowel sounds normal; no masses,  no organomegaly, incision: clean, dry and intact and  Hypoactive bowel sounds, soft, nontender. Extremities:  Ted hose and PAS on.  DPs 2+ bilaterally.  Vaginal Bleeding: minimal  Assessment: s/p Procedure(s): HYSTERECTOMY ABDOMINAL (N/A) SALPINGO OOPHORECTOMY with collection of pelvic washings (Bilateral): stable  Concentrated urine.   Plan:  Clear liquids.  IV fluid bolus - 500 cc LR over 2 hours.  CBC and BMP in am.  PCA and toradol for pain management.  Surgical findings and procedure discussed with patient.  Questions answered.   LOS: 0 days    Arloa Koh 07/17/2014, 5:11 PM

## 2014-07-18 ENCOUNTER — Encounter (HOSPITAL_COMMUNITY): Payer: Self-pay | Admitting: Obstetrics and Gynecology

## 2014-07-18 LAB — BASIC METABOLIC PANEL
Anion gap: 5 (ref 5–15)
BUN: 12 mg/dL (ref 6–23)
CO2: 27 mmol/L (ref 19–32)
Calcium: 8.3 mg/dL — ABNORMAL LOW (ref 8.4–10.5)
Chloride: 104 mEq/L (ref 96–112)
Creatinine, Ser: 0.6 mg/dL (ref 0.50–1.10)
GFR calc Af Amer: >90 mL/min (ref >90)
GFR calc non Af Amer: >90 mL/min (ref >90)
GLUCOSE: 111 mg/dL — AB (ref 70–99)
Potassium: 3.6 mmol/L (ref 3.5–5.1)
Sodium: 136 mmol/L (ref 135–145)

## 2014-07-18 LAB — CBC
HCT: 32.6 % — ABNORMAL LOW (ref 36.0–46.0)
HEMOGLOBIN: 11.2 g/dL — AB (ref 12.0–15.0)
MCH: 30.2 pg (ref 26.0–34.0)
MCHC: 34.4 g/dL (ref 30.0–36.0)
MCV: 87.9 fL (ref 78.0–100.0)
Platelets: 215 10*3/uL (ref 150–400)
RBC: 3.71 MIL/uL — ABNORMAL LOW (ref 3.87–5.11)
RDW: 12.8 % (ref 11.5–15.5)
WBC: 15.9 10*3/uL — ABNORMAL HIGH (ref 4.0–10.5)

## 2014-07-18 MED FILL — Heparin Sodium (Porcine) Inj 5000 Unit/ML: INTRAMUSCULAR | Qty: 1 | Status: AC

## 2014-07-18 NOTE — Progress Notes (Signed)
1 Day Post-Op Procedure(s) (LRB): HYSTERECTOMY ABDOMINAL (N/A) SALPINGO OOPHORECTOMY with collection of pelvic washings (Bilateral)  Subjective: Patient reports good pain control.  Out of bed last hs.  Foley out.  No void yet.  Hungry.    Objective: I have reviewed patient's vital signs, intake and output and labs. Tmax 99.3, T now 99.2, BP 113/69, P 81, RR 18 I/O - 6927/2200 cc WBC 15.9 Hgb 11.2 Glucose 111 Ca 8.3  General: alert Resp: clear to auscultation bilaterally Cardio: regular rate and rhythm, S1, S2 normal, no murmur, click, rub or gallop GI: soft, non-tender; bowel sounds normal; no masses,  no organomegaly and incision: clean, dry and intact Extremities:  Ted hose and PAS on.  Vaginal Bleeding: minimal  Assessment: s/p Procedure(s): HYSTERECTOMY ABDOMINAL (N/A) SALPINGO OOPHORECTOMY with collection of pelvic washings (Bilateral): progressing well  Plan: Advance diet Encourage ambulation Advance to PO medication  LOS: 1 day    Arloa Koh 07/18/2014, 7:43 AM

## 2014-07-18 NOTE — Op Note (Addendum)
Denise Munoz, Denise Munoz                 ACCOUNT NO.:  [id number]  MEDICAL RECORD NO.:  [id number]  LOCATION:  3734                          FACILITY:  [facility]  PHYSICIAN:  Lenard Galloway, M.D.   DATE OF BIRTH:  30-May-1956  DATE OF PROCEDURE:  07/17/2014 DATE OF DISCHARGE:                              OPERATIVE REPORT   PREOPERATIVE DIAGNOSIS:  Bilateral complex adnexal masses, uterine fibroids.  POSTOPERATIVE DIAGNOSIS:  Bilateral complex adnexal masses, uterine fibroids.  PROCEDURE:  Total abdominal hysterectomy with bilateral salpingo- oophorectomy, collection of pelvic washings.  SURGEON:  Lenard Galloway, M.D.  ASSISTANT:  Lyman Speller MD  ANESTHESIA:  General endotracheal, subcutaneous Exparel.  IV FLUIDS:  2000 mL Ringer's lactate.  ESTIMATED BLOOD LOSS:  150 mL .  URINE OUTPUT:  75 mL.  COMPLICATIONS:  None.  INDICATIONS FOR THE PROCEDURE:  The patient is a 59 year old gravida 1, para 51 Caucasian female who was found to have a pelvic mass on routine gynecologic exam.  Ultrasound demonstrated bilateral complex adnexal masses, the right measuring 12.3 cm and the left measuring 8.5 cm.  The cyst was multiloculated with smooth borders and no abnormal blood flow. There was no free fluid in the pelvis.  The uterus contained 3 intramural fibroids, the largest measuring 3.7 cm.  The endometrium was 4.95 mm.  The patient had a CA-125 of 16 and a CEA of 2.3.  The patient had consultation with Dr. Janie Morning of [facility], and the patient was told that the risk of malignancy was slow and that it was acceptable to proceed with the surgery by a general gynecologist. A plan is now made to proceed with a total abdominal hysterectomy with bilateral salpingo-oophorectomy and collection of pelvic washings. Risks, benefits, and alternatives have been reviewed with the patient who wishes to proceed.  FINDINGS:  Laparotomy revealed a 15 cm multiloculated smooth  right ovary.  There were no excrescences on the surface of the ovary.  The left ovary had a similar appearance and measured approximately 8 cm in its largest diameter.  The fallopian tubes were both stretched across the ovaries.  The uterus contained multiple small fibroids.  The right adnexa was adherent to the right pelvic sidewall.  There was no evidence of endometriosis in the pelvis.  The upper abdomen palpated to be unremarkable.  The liver was smooth. The gallbladder was normal.  The bilateral kidneys were unremarkable and the periaortic region demonstrated no adenopathy.  There was no ascites noted upon entry into the peritoneal cavity.  SPECIMENS:  The bilateral ovaries and tubes and the uterus and cervix were sent to Pathology separately from the pelvic washings specimen.  PROCEDURE IN DETAIL:  The patient was reidentified in the preoperative hold area.  The patient did receive cefotetan 2 g IV.  She received TED hose and PAS stockings for DVT prophylaxis.  In the operating room, the patient was placed in the Hallock in the dorsal lithotomy position.  She received general endotracheal anesthesia.  The abdomen and vagina were sterilely prepped and the patient was draped.  A Foley catheter was sterilely placed inside the bladder.  The procedure began  by creating a vertical midline incision with a scalpel.  The dissection was carried down to the fascia with monopolar cautery for hemostasis.  The fascia was then incised vertically in the midline using a scalpel and the Mayo scissors.  The rectus muscles were dissected off the posterior sheath of the rectus muscle.  The peritoneum was opened superiorly and inferiorly. The posterior sheath and peritoneum were then elevated with 2 hemostat clamps and the peritoneal cavity was entered sharply with the Metzenbaum scissors.  The peritoneal incision was extended cranially and caudally.  Pelvic washings were performed at this  time and collected and sent to Pathology.  The patient was placed in the Trendelenburg position at this time and the bowel was packed into the upper abdomen in order to gain access to the large pelvic mass.  Blunt finger dissection was used to free the right ovary partially from the right pelvic sidewall.  This allowed visualization of the ureter along the right pelvic sidewall, so that the retroperitoneal space could be entered near the right infundibulopelvic ligament.  The peritoneum was entered sharply and this area dissection continued underneath the right infundibulopelvic ligament until a clear space could be cleared in the peritoneum just below this.  The infundibulopelvic ligament was then doubly clamped and cut.  A free tie followed by suture ligature of the same was then placed on the infundibulopelvic ligament and this was hemostatic.  The right round ligament was then identified and it was sutured with 2 sutures of transfixing 0 Vicryl.  The ligament was then sharply divided between these sutures.  Dissection along the broad ligament then allowed further mobility of the pelvic mass on the patient's right-hand side. Eventually a long Kelly clamp was placed across the proximal fallopian tube and the utero-ovarian ligament on the patient's right-hand side. Again a window was created through the broad ligament until a Heaney clamp could be placed across the utero-ovarian ligament.  The utero- ovarian ligament was then sharply divided and the specimen was freed and sent to Pathology.  Hemostasis was good at this time.  At this time, a self-retaining retractor was placed inside the pelvis and the bowel was repacked into the upper abdomen using moist lap pads. The patient was placed in the Trendelenburg position.  Attention was turned to the left tube and ovary.  A Kelly clamp was placed across the adnexal structures on the patient's left-hand side. The left round ligament was  isolated and suture ligated with a transfixing suture of 0 Vicryl.  It was then divided with monopolar cautery.  The ureter was identified along the patient's right pelvic sidewall and a window was created through the peritoneum so that the left infundibulopelvic ligament could be isolated and doubly clamped with 2 Heaney clamps.  It was sharply divided.  The pedicle was then tied with a free tie 0 Vicryl followed by suture ligature of the same. Hemostasis was good.  A second clamp was then placed across the utero- ovarian ligament and the proximal fallopian tube and the left fallopian tube and ovary were sharply excised and sent to Pathology.  The bladder flap was sharply taken down anteriorly.  The uterine arteries were skeletonized bilaterally.  Each of the uterine artery pedicles were then clamped, sharply divided, and suture ligated with 2 sutures of 0 Vicryl on each side.  Dissection continued along the anterior cervix.  Straight Heaney clamps were used to clamp the remaining inferior branches of the uterine arteries along the cardinal ligaments.  These specimens were sharply divided and then suture ligated with 0 Vicryl.  Curved Heaney clamps were then placed across the uterosacral ligaments bilaterally. Transfixing sutures of 0 Vicryl were placed after these pedicles were sharply divided.  The remainder of the cervix was freed by using a Mayo scissors.  The uterus and cervix were sent to Pathology along with the bilateral tubes and ovaries.  The remainder of the vaginal cuff was then closed with figure-of-eight sutures of 0 Vicryl.  The pelvis was then irrigated and suctioned and the pedicle sites were all examined and noted to be hemostatic.  There was a small amount of oozing along the right pelvic sidewall where the adnexal mass was adherent.  The ureter was identified along the right pelvic sidewall was noted to peristalsis.  Gelfoam was placed over this raw surface of  the retroperitoneum and hemostasis was then good.  With all pedicle sites hemostatic, the self-retaining retractor was removed along with the lap pads which had been used to pack the bowel.  The parietal peritoneum was closed with a running suture of 2-0 Vicryl. The fascia was closed with a running suture of 0 PDS.  The subcutaneous layer was irrigated, suctioned, and made hemostatic with monopolar cautery.  The subcutaneous layer was injected with Exparel diluted in normal saline.  Interrupted sutures of 2-0 plain were placed in the subcutaneous layer.  The subdermal layer was then injected with more Exparel.  The skin was closed with a running subcutaneous suture of 3-0 Vicryl.  Steri-Strips and benzoin were placed over the incision followed by honeycomb dressing.  The patient was awakened and extubated and escorted to the recovery room in stable condition.  There were no complications to the procedure.  All needle, instrument, and sponge counts were correct.     Lenard Galloway, M.D.     BES/MEDQ  D:  07/17/2014  T:  07/17/2014  Job:  [id number]

## 2014-07-19 LAB — CBC
HCT: 32.5 % — ABNORMAL LOW (ref 36.0–46.0)
Hemoglobin: 11 g/dL — ABNORMAL LOW (ref 12.0–15.0)
MCH: 30.1 pg (ref 26.0–34.0)
MCHC: 33.8 g/dL (ref 30.0–36.0)
MCV: 89 fL (ref 78.0–100.0)
Platelets: 198 10*3/uL (ref 150–400)
RBC: 3.65 MIL/uL — ABNORMAL LOW (ref 3.87–5.11)
RDW: 13 % (ref 11.5–15.5)
WBC: 12.3 10*3/uL — ABNORMAL HIGH (ref 4.0–10.5)

## 2014-07-19 MED ORDER — IBUPROFEN 600 MG PO TABS: 600.0000 mg | ORAL_TABLET | Freq: Four times a day (QID) | ORAL | Status: DC | PRN

## 2014-07-19 MED ORDER — OXYCODONE-ACETAMINOPHEN 5-325 MG PO TABS: 1.0000 | ORAL_TABLET | ORAL | Status: DC | PRN

## 2014-07-19 NOTE — Discharge Instructions (Signed)
Abdominal Hysterectomy, Care After Refer to this sheet in the next few weeks. These instructions provide you with information on caring for yourself after your procedure. Your health care provider may also give you more specific instructions. Your treatment has been planned according to current medical practices, but problems sometimes occur. Call your health care provider if you have any problems or questions after your procedure.  WHAT TO EXPECT AFTER THE PROCEDURE After your procedure, it is typical to have the following:  Pain.  Feeling tired.  Poor appetite.  Less interest in sex. HOME CARE INSTRUCTIONS  It takes 4-6 weeks to recover from this surgery. Make sure you follow all your health care provider's instructions. Home care instructions may include:  Take pain medicines only as directed by your health care provider. Do not take over-the-counter pain medicines without checking with your health care provider first.  Change your bandage as directed by your health care provider.  Return to your health care provider to have your sutures taken out.  Take showers instead of baths for 2-3 weeks. Ask your health care provider when it is safe to start showering.  Do not douche, use tampons, or have sexual intercourse for at least 6 weeks or until your health care provider says you can.   Follow your health care provider's advice about exercise, lifting, driving, and general activities.  Get plenty of rest and sleep.   Do not lift anything heavier than a gallon of milk (about 10 lb [4.5 kg]) for the first month after surgery.  You can resume your normal diet if your health care provider says it is okay.   Do not drink alcohol until your health care provider says you can.   If you are constipated, ask your health care provider if you can take a mild laxative.  Eating foods high in fiber may also help with constipation. Eat plenty of raw fruits and vegetables, whole grains, and  beans.  Drink enough fluids to keep your urine clear or pale yellow.   Try to have someone at home with you for the first 1-2 weeks to help around the house.  Keep all follow-up appointments. SEEK MEDICAL CARE IF:   You have chills or fever.  You have swelling, redness, or pain in the area of your incision that is getting worse.   You have pus coming from the incision.   You notice a bad smell coming from the incision or bandage.   Your incision breaks open.   You feel dizzy or light-headed.   You have pain or bleeding when you urinate.   You have persistent diarrhea.   You have persistent nausea and vomiting.   You have abnormal vaginal discharge.   You have a rash.   You have any type of abnormal reaction or develop an allergy to your medicine.   Your pain medicine is not helping.  SEEK IMMEDIATE MEDICAL CARE IF:   You have a fever and your symptoms suddenly get worse.  You have severe abdominal pain.  You have chest pain.  You have shortness of breath.  You faint.  You have pain, swelling, or redness of your leg.  You have heavy vaginal bleeding with blood clots. MAKE SURE YOU:  Understand these instructions.  Will watch your condition.  Will get help right away if you are not doing well or get worse. Document Released: 01/16/2005 Document Revised: 07/04/2013 Document Reviewed: 04/21/2013 ExitCare Patient Information 2015 [employer]. This information is not intended   to replace advice given to you by your health care provider. Make sure you discuss any questions you have with your health care provider.  

## 2014-07-19 NOTE — Progress Notes (Signed)
2 Days Post-Op Procedure(s) (LRB): HYSTERECTOMY ABDOMINAL (N/A) SALPINGO OOPHORECTOMY with collection of pelvic washings (Bilateral)  Subjective: Patient reports tolerating PO, + flatus, + BM and no problems voiding.    Objective: I have reviewed patient's vital signs, intake and output and labs. WBC 12.3 General: alert and cooperative Resp:  Mild rales at bases. Cardio: regular rate and rhythm, S1, S2 normal, no murmur, click, rub or gallop GI: soft, non-tender; bowel sounds normal; no masses,  no organomegaly and incision: intact and Mild dried blood.  Assessment: s/p Procedure(s): HYSTERECTOMY ABDOMINAL (N/A) SALPINGO OOPHORECTOMY with collection of pelvic washings (Bilateral): progressing well and  Ready for discharge.  Plan: Discharge home  Percocet and Motrin for pain.  Instructions and precautions given.  Follow up in 4 days.    LOS: 2 days    Arloa Koh 07/19/2014, 7:57 AM

## 2014-07-19 NOTE — Progress Notes (Signed)
Pt verbalizes understanding of d/c instructions, medications, follow up appt, and when to seek medical attn. IV was d/c by NT. Pt has no questions at this time, has copy of d/c instructions and prescriptions. Encouraged to check room  For belongings thoroughly prior to d/c. Pt ambulated to main entrance, accompanied by her son and a staff member. Marry Guan

## 2014-07-23 ENCOUNTER — Other Ambulatory Visit: Payer: Self-pay | Admitting: Obstetrics and Gynecology

## 2014-07-23 ENCOUNTER — Ambulatory Visit (INDEPENDENT_AMBULATORY_CARE_PROVIDER_SITE_OTHER): Payer: BC Managed Care – PPO | Admitting: Obstetrics and Gynecology

## 2014-07-23 ENCOUNTER — Encounter: Payer: Self-pay | Admitting: Obstetrics and Gynecology

## 2014-07-23 VITALS — BP 150/98 | HR 88 | Ht 61.75 in | Wt 198.2 lb

## 2014-07-23 DIAGNOSIS — R1900 Intra-abdominal and pelvic swelling, mass and lump, unspecified site: Secondary | ICD-10-CM

## 2014-07-23 DIAGNOSIS — T888XXA Other specified complications of surgical and medical care, not elsewhere classified, initial encounter: Secondary | ICD-10-CM

## 2014-07-23 DIAGNOSIS — Z9889 Other specified postprocedural states: Secondary | ICD-10-CM

## 2014-07-23 NOTE — Progress Notes (Signed)
CT abd/pelvis with contrast scheduled for 07-24-14 at 440 at [facility], [place]. Patient to pick up contrast medium and they will give additional instructions. She is agreeable to date and time of appointment.

## 2014-07-23 NOTE — Progress Notes (Signed)
GYNECOLOGY  VISIT   HPI: 59 y.o.   Divorced  Caucasian  female   G1P1000 with Patient's last menstrual period was 05/13/2006.   here for 1 week post op  No bleeding.  Has more swelling on the right side.  Feels pressure in the genital region.  Feels barbed wire inside.  Took one Percocet yesterday and this was not enough.   Having BMs twice a day. Soft BMs. No nausea or vomiting.   No fevers.   No dysuria.   Walking 10 minutes 3 - 4 times a day.     GYNECOLOGIC HISTORY: Patient's last menstrual period was 05/13/2006. Contraception: TAH Menopausal hormone therapy: n/a        OB History    Gravida Para Term Preterm AB TAB SAB Ectopic Multiple Living   1 1 1                 Patient Active Problem List   Diagnosis Date Noted  . Status post total abdominal hysterectomy 07/17/2014  . Fibroids, intramural 05/24/2014  . Bilateral ovarian cysts 05/24/2014  . Leukocytosis 05/17/2014  . Morbid obesity 05/17/2014  . Chronic fatigue 05/17/2014  . Chronic pain of multiple joints 05/17/2014    Past Medical History  Diagnosis Date  . Arthritis   . Leukocytosis   . Scarlet fever   . Leukocytosis 05/17/2014  . Abnormal Pap smear of cervix ~1986  . History of mumps as a child   . Headache     history of migraines  . Fibromyalgia   . Blood dyscrasia     elevated white blood cells-but rheumatologist said no disorder    Past Surgical History  Procedure Laterality Date  . Tonsillectomy    . Colposcopy  ~1986    Normal  . Abdominal hysterectomy N/A 07/17/2014    Procedure: HYSTERECTOMY ABDOMINAL;  Surgeon: Jamey Reas de Berton Lan, MD;  Location: [facility];  Service: Gynecology;  Laterality: N/A;  . Salpingoophorectomy Bilateral 07/17/2014    Procedure: SALPINGO OOPHORECTOMY with collection of pelvic washings;  Surgeon: Jamey Reas de Berton Lan, MD;  Location: [facility];  Service: Gynecology;  Laterality: Bilateral;    Current Outpatient Prescriptions   Medication Sig Dispense Refill  . cyclobenzaprine (FLEXERIL) 10 MG tablet Take 5 mg by mouth at bedtime as needed for muscle spasms. Takes 1/2 tablet    . ibuprofen (ADVIL,MOTRIN) 600 MG tablet Take 1 tablet (600 mg total) by mouth every 6 (six) hours as needed (mild pain). 30 tablet 0  . Multiple Vitamin (MULTIVITAMIN) tablet Take 1 tablet by mouth daily.    Marland Kitchen oxyCODONE-acetaminophen (PERCOCET/ROXICET) 5-325 MG per tablet Take 1-2 tablets by mouth every 4 (four) hours as needed for severe pain (moderate to severe pain (when tolerating fluids)). 30 tablet 0   No current facility-administered medications for this visit.     ALLERGIES: Doxycycline  Family History  Problem Relation Age of Onset  . Breast cancer Mother 75  . Alzheimer's disease Father 109  . Breast cancer Maternal Grandmother 70    History   Social History  . Marital Status: Divorced    Spouse Name: N/A    Number of Children: N/A  . Years of Education: N/A   Occupational History  . Not on file.   Social History Main Topics  . Smoking status: Former Smoker -- 0.50 packs/day for 36 years    Quit date: 05/17/2010  . Smokeless tobacco: Never Used  . Alcohol Use: No  .  Drug Use: No  . Sexual Activity: Not Currently    Birth Control/ Protection: Post-menopausal   Other Topics Concern  . Not on file   Social History Narrative    ROS:  Pertinent items are noted in HPI.  PHYSICAL EXAMINATION:    BP 150/98 mmHg  Pulse 88  Ht 5' 1.75" (1.568 m)  Wt 198 lb 3.2 oz (89.903 kg)  BMI 36.57 kg/m2  LMP 05/13/2006     General appearance: alert, cooperative and appears stated age   Abdomen: Incision intact with midportion with circular 6 cm area of induration and no drainage, non-tender; no organomegaly   Pelvic: External genitalia:  no lesions              Urethra:  normal appearing urethra with no masses, tenderness or lesions              Bartholins and Skenes: normal                 Vagina: normal appearing  vagina with normal color and discharge, no lesions.  Cuff intact.              Cervix: Absent.                    Bimanual Exam:  Uterus:  Absent.                                      Adnexa: No masses                                  ASSESSMENT  Possible incisional hernia or hematoma.  PLAN  Proceed with CT scan of abdomen with contrast.   Return for 6 week post op visit and prn depending on the CT result.   An After Visit Summary was printed and given to the patient.  __25____ minutes face to face time of which over 50% was spent in counseling.

## 2014-07-24 ENCOUNTER — Ambulatory Visit
Admission: RE | Admit: 2014-07-24 | Discharge: 2014-07-24 | Disposition: A | Discharge status: Home / Self Care | Payer: BC Managed Care – PPO | Source: Ambulatory Visit | Attending: Obstetrics and Gynecology | Admitting: Obstetrics and Gynecology

## 2014-07-24 ENCOUNTER — Telehealth: Payer: Self-pay | Admitting: Obstetrics and Gynecology

## 2014-07-24 DIAGNOSIS — T888XXA Other specified complications of surgical and medical care, not elsewhere classified, initial encounter: Secondary | ICD-10-CM

## 2014-07-24 DIAGNOSIS — R1900 Intra-abdominal and pelvic swelling, mass and lump, unspecified site: Secondary | ICD-10-CM

## 2014-07-24 MED ORDER — IOHEXOL 300 MG/ML  SOLN
100.0000 mL | Freq: Once | INTRAMUSCULAR | Status: AC | PRN
  Administered 2014-07-24: 100 mL via INTRAVENOUS

## 2014-07-24 NOTE — Telephone Encounter (Signed)
CT scan scheduled for 07/25/13 needs prior authorization.

## 2014-07-25 ENCOUNTER — Telehealth: Payer: Self-pay | Admitting: Obstetrics and Gynecology

## 2014-07-25 NOTE — Telephone Encounter (Signed)
Patient is returning a call to [place].

## 2014-07-25 NOTE — Telephone Encounter (Signed)
Phone call to patient regarding results. Left message to return my call.  No details left.  CT scan showed no signs of hernia of the abdominal wall.  Has fatty liver infiltration, small hiatal hernia, and diverticulosis.  Also has a small fluid collection in pelvis where uterus used to be.   Overall, no concerns!

## 2014-07-25 NOTE — Telephone Encounter (Signed)
Phone call to discuss with patient.  No hernia seen.  Other findings discussed.   Keep follow up appt.

## 2014-07-29 NOTE — Discharge Summary (Signed)
Physician Discharge Summary  Patient ID: Denise Munoz MRN: [id number] DOB/AGE: January 31, 1956 59 y.o.  Admit date: 07/17/2014 Discharge date: 07/19/14  Admission Diagnoses: 1.  Bilateral adnexal masses. 2.  Uterine fibroids. 3.  Leukocytosis.  Discharge Diagnoses:  1.  Bilateral adnexal masses. 2.  Uterine fibroids. 3.  Status post total abdominal hysterectomy with bilateral salpingo-oophorectomy, collection of pelvic washings on 07/17/14. 4.  Leukocytosis.   Active Problems:   Status post total abdominal hysterectomy   Discharged Condition: good  Hospital Course:  The patient was admitted on 07/17/14  for a total abdominal hysterectomy with bilateral salpingo-oophorectomy and collection of pelvic washings which were performed without complication while under general anesthesia.  The patient's post op course was uneventful.  She had a  PCA and Toradol for pain control initially, and this was converted over to Percocet and Motrin on post op day one when the patient began taking po well.  She ambulated independently and wore PAS and Ted hose for DVT prophylaxis while in bed.   The patient's vital signs remained stable and she demonstrated no signs of infection during her hospitalization.  Her incision was intact and demonstrated no significant drainage or any signs of erythema. The patient's post op day one Hgb was 11.2 and her WBC was 15.9.  Her post op day two Hgb was 11.0, and her WBC was 12.3. She was found to be in good condition and ready for discharge on post op day two.   Consults: None  Significant Diagnostic Studies: labs:  See Hospital Course.  Treatments: surgery:  total abdominal hysterectomy with bilateral salpingo-oophorectomy, collection of pelvic washings on 07/17/14.  Discharge Exam: Blood pressure 116/61, pulse 95, temperature 98.7 F (37.1 C), temperature source Oral, resp. rate 18, height 5' 1.75" (1.568 m), weight 200 lb (90.719 kg), last menstrual period 05/13/2006, SpO2 93  %. General: alert and cooperative Resp:  Mild rales at bases. Cardio: regular rate and rhythm, S1, S2 normal, no murmur, click, rub or gallop GI: soft, non-tender; bowel sounds normal; no masses, no organomegaly and incision: intact and Mild dried blood.   Disposition: 01-Home or Self Care  Instructions and precautions were given in oral and written form.     Medication List    STOP taking these medications        acetaminophen 325 MG tablet  Commonly known as:  TYLENOL      TAKE these medications        cyclobenzaprine 10 MG tablet  Commonly known as:  FLEXERIL  Take 5 mg by mouth at bedtime as needed for muscle spasms. Takes 1/2 tablet     ibuprofen 600 MG tablet  Commonly known as:  ADVIL,MOTRIN  Take 1 tablet (600 mg total) by mouth every 6 (six) hours as needed (mild pain).     multivitamin tablet  Take 1 tablet by mouth daily.     oxyCODONE-acetaminophen 5-325 MG per tablet  Commonly known as:  PERCOCET/ROXICET  Take 1-2 tablets by mouth every 4 (four) hours as needed for severe pain (moderate to severe pain (when tolerating fluids)).           Follow-up Information    Follow up with Arloa Koh, MD On 07/23/2014.   Specialty:  Obstetrics and Gynecology   Contact information:   [address] [place] [address] [phone]       Signed: Arloa Koh 07/29/2014, 2:14 PM

## 2014-08-27 ENCOUNTER — Telehealth: Payer: Self-pay | Admitting: Obstetrics and Gynecology

## 2014-08-27 ENCOUNTER — Ambulatory Visit: Payer: BC Managed Care – PPO | Admitting: Obstetrics and Gynecology

## 2014-08-27 NOTE — Telephone Encounter (Signed)
Patient canceled post op appointment due to the weather. Call to patient to reschedule appointment LMTCB to schedule.

## 2014-08-28 NOTE — Telephone Encounter (Signed)
Call to patient. Postop appointment rescheduled for 09/05/2014 at 3:30. Patient declined earlier appointment due to scheduling conflicts.  Routing to provider for final review. Patient agreeable to disposition. Will close encounter

## 2014-08-28 NOTE — Telephone Encounter (Signed)
Return call to patient. Left message to call back. 

## 2014-08-28 NOTE — Telephone Encounter (Signed)
Pt returning call

## 2014-08-28 NOTE — Telephone Encounter (Signed)
Please call patient to reschedule 6 week post op appointment. She canceled yesterday due to the weather.

## 2014-09-05 ENCOUNTER — Ambulatory Visit (INDEPENDENT_AMBULATORY_CARE_PROVIDER_SITE_OTHER): Payer: BC Managed Care – PPO | Admitting: Obstetrics and Gynecology

## 2014-09-05 ENCOUNTER — Encounter: Payer: Self-pay | Admitting: Obstetrics and Gynecology

## 2014-09-05 VITALS — BP 128/80 | HR 88 | Ht 61.75 in | Wt 198.8 lb

## 2014-09-05 DIAGNOSIS — Z9079 Acquired absence of other genital organ(s): Secondary | ICD-10-CM

## 2014-09-05 DIAGNOSIS — N393 Stress incontinence (female) (male): Secondary | ICD-10-CM

## 2014-09-05 DIAGNOSIS — Z90722 Acquired absence of ovaries, bilateral: Secondary | ICD-10-CM

## 2014-09-05 DIAGNOSIS — Z90710 Acquired absence of both cervix and uterus: Secondary | ICD-10-CM

## 2014-09-05 NOTE — Progress Notes (Signed)
GYNECOLOGY  VISIT   HPI: 59 y.o.   Divorced  Caucasian  female   Denise Munoz with Patient's last menstrual period was 05/13/2006.   here for Post op Status post TAH/BSO on 07/17/14.  Went back to work last week.  Some fatigue and incisional discomfort when works a full day.  Uses Advil and Tylenol together. Takes two at a time, twice a day.  Having some stomach irritation that goes away when stops Advil.  Leaks a little bit with sneeze or cough.  Wearing a panty liner now.  Had this only once in a while prior to surgery.   GYNECOLOGIC HISTORY: Patient's last menstrual period was 05/13/2006. Contraception:   TAH Menopausal hormone therapy: None         OB History    Gravida Para Term Preterm AB TAB SAB Ectopic Multiple Living   1 1 1                 Patient Active Problem List   Diagnosis Date Noted  . Status post total abdominal hysterectomy 07/17/2014  . Fibroids, intramural 05/24/2014  . Bilateral ovarian cysts 05/24/2014  . Leukocytosis 05/17/2014  . Morbid obesity 05/17/2014  . Chronic fatigue 05/17/2014  . Chronic pain of multiple joints 05/17/2014    Past Medical History  Diagnosis Date  . Arthritis   . Leukocytosis   . Scarlet fever   . Leukocytosis 05/17/2014  . Abnormal Pap smear of cervix ~1986  . History of mumps as a child   . Headache     history of migraines  . Fibromyalgia   . Blood dyscrasia     elevated white blood cells-but rheumatologist said no disorder    Past Surgical History  Procedure Laterality Date  . Tonsillectomy    . Colposcopy  ~1986    Normal  . Abdominal hysterectomy N/A 07/17/2014    Procedure: HYSTERECTOMY ABDOMINAL;  Surgeon: Jamey Reas de Berton Lan, MD;  Location: [facility];  Service: Gynecology;  Laterality: N/A;  . Salpingoophorectomy Bilateral 07/17/2014    Procedure: SALPINGO OOPHORECTOMY with collection of pelvic washings;  Surgeon: Jamey Reas de Berton Lan, MD;  Location: [facility];  Service: Gynecology;   Laterality: Bilateral;    Current Outpatient Prescriptions  Medication Sig Dispense Refill  . acetaminophen (TYLENOL) 500 MG tablet Take 500 mg by mouth every 6 (six) hours as needed. Patient takes 500-1,000 mg prn    . ibuprofen (ADVIL,MOTRIN) 200 MG tablet Take 200 mg by mouth every 6 (six) hours as needed. Patient takes 200-400 mg prn    . Multiple Vitamin (MULTIVITAMIN) tablet Take 1 tablet by mouth daily.     No current facility-administered medications for this visit.     ALLERGIES: Doxycycline  Family History  Problem Relation Age of Onset  . Breast cancer Mother 80  . Alzheimer's disease Father 88  . Breast cancer Maternal Grandmother 70    History   Social History  . Marital Status: Divorced    Spouse Name: N/A  . Number of Children: N/A  . Years of Education: N/A   Occupational History  . Not on file.   Social History Main Topics  . Smoking status: Former Smoker -- 0.50 packs/day for 36 years    Quit date: 05/17/2010  . Smokeless tobacco: Never Used  . Alcohol Use: No  . Drug Use: No  . Sexual Activity: Not Currently    Birth Control/ Protection: Post-menopausal   Other Topics Concern  .  Not on file   Social History Narrative    ROS:  Pertinent items are noted in HPI.  PHYSICAL EXAMINATION:    BP 128/80 mmHg  Pulse 88  Ht 5' 1.75" (1.568 m)  Wt 198 lb 12.8 oz (90.175 kg)  BMI 36.68 kg/m2  LMP 05/13/2006     General appearance: alert, cooperative and appears stated age   Abdomen: vertical midline incision intact and without masses, soft, non-tender; no masses,  no organomegaly    Pelvic: External genitalia:  no lesions              Urethra:  normal appearing urethra with no masses, tenderness or lesions              Bartholins and Skenes: normal                 Vagina: normal appearing vagina with normal color and discharge, no lesions.  One tiny spot of blood after internal exam.               Cervix: absent.                   Bimanual  Exam:  Uterus:  uterus is absent                                      Adnexa:  nontender and no masses                                        ASSESSMENT  Status post TAH/BSO. Incisional soreness.  No herniation.  New onset urinary stress incontinence.   PLAN  Stop Advil.  Take Pepcid or Zantac. Return to normal activity slowly.  Discussed recovery expectations for a vertical midline incision and major surgery.  Kegel's.  Discussed referral for pelvic floor rehabilitation if desires.  Will consider.  Return for annual exam and prn.   An After Visit Summary was printed and given to the patient.  _20_____ minutes face to face time of which over 50% was spent in counseling.

## 2014-10-24 ENCOUNTER — Telehealth: Payer: Self-pay | Admitting: Obstetrics and Gynecology

## 2014-10-24 NOTE — Telephone Encounter (Signed)
Patient calling requesting to speak with nurse about, "hives" on her legs, neck, and abdomen. She says she had surgery in January 2016 but is "not sure if this is related."

## 2014-10-24 NOTE — Telephone Encounter (Signed)
Patient states she is having a rash on her legs, abdomen and neck. Has done Internet research and feels may be skin changes r/t post hysterectomy hormone changes. Advised hives not likely r/t surgery. Patient states she is taking epsom salt baths and benadryl with some relief.  Patient is advised to seek care with PCP. She states she has called pcp today and PCP not in office but offered an appointment with another provider whom patient prefers not to see. She states she will go to urgent care today at 530 when the [facility] urgent care opens up. She is advised to seek care immediately if begins have sob or any other concerns. Patient agreeable.  Routing to provider for final review. Patient agreeable to disposition. Will close encounter

## 2014-11-09 ENCOUNTER — Telehealth: Payer: Self-pay | Admitting: Medical Oncology

## 2014-11-09 ENCOUNTER — Other Ambulatory Visit: Payer: Self-pay | Admitting: Hematology and Oncology

## 2014-11-09 ENCOUNTER — Encounter: Payer: Self-pay | Admitting: Hematology and Oncology

## 2014-11-09 ENCOUNTER — Telehealth: Payer: Self-pay | Admitting: Hematology and Oncology

## 2014-11-09 NOTE — Telephone Encounter (Signed)
returned call and lvm for pt confirming appt

## 2014-11-09 NOTE — Telephone Encounter (Signed)
Thanks for the info I have cancelled them

## 2014-11-09 NOTE — Telephone Encounter (Signed)
Pt called and I sent this email to cancel her appt . " Cancel appointments for Lab work @ 8:30 am and w/Denise Munoz @ 9:00 am on May 5th. Called twice during normal office hours, but only received answering machine to leave message. Will not be rescheduling with Denise Munoz, rather will be seeing another doctor for this issue. ". There was no reason stated for cancelling.

## 2014-11-15 ENCOUNTER — Other Ambulatory Visit: Payer: BC Managed Care – PPO

## 2014-11-15 ENCOUNTER — Ambulatory Visit: Payer: BC Managed Care – PPO | Admitting: Hematology and Oncology

## 2015-07-26 ENCOUNTER — Ambulatory Visit: Payer: BC Managed Care – PPO | Admitting: Obstetrics and Gynecology

## 2015-08-16 ENCOUNTER — Encounter: Payer: Self-pay | Admitting: Obstetrics and Gynecology

## 2015-08-16 ENCOUNTER — Ambulatory Visit (INDEPENDENT_AMBULATORY_CARE_PROVIDER_SITE_OTHER): Payer: BC Managed Care – PPO | Admitting: Obstetrics and Gynecology

## 2015-08-16 ENCOUNTER — Telehealth: Payer: Self-pay | Admitting: Obstetrics and Gynecology

## 2015-08-16 VITALS — Ht 61.5 in | Wt 207.0 lb

## 2015-08-16 DIAGNOSIS — R5381 Other malaise: Secondary | ICD-10-CM | POA: Diagnosis not present

## 2015-08-16 DIAGNOSIS — R6889 Other general symptoms and signs: Secondary | ICD-10-CM | POA: Diagnosis not present

## 2015-08-16 DIAGNOSIS — E785 Hyperlipidemia, unspecified: Secondary | ICD-10-CM

## 2015-08-16 DIAGNOSIS — N898 Other specified noninflammatory disorders of vagina: Secondary | ICD-10-CM

## 2015-08-16 DIAGNOSIS — Z01419 Encounter for gynecological examination (general) (routine) without abnormal findings: Secondary | ICD-10-CM | POA: Diagnosis not present

## 2015-08-16 DIAGNOSIS — R5383 Other fatigue: Secondary | ICD-10-CM

## 2015-08-16 DIAGNOSIS — R109 Unspecified abdominal pain: Secondary | ICD-10-CM

## 2015-08-16 DIAGNOSIS — Z1211 Encounter for screening for malignant neoplasm of colon: Secondary | ICD-10-CM | POA: Diagnosis not present

## 2015-08-16 DIAGNOSIS — R7989 Other specified abnormal findings of blood chemistry: Secondary | ICD-10-CM

## 2015-08-16 DIAGNOSIS — R140 Abdominal distension (gaseous): Secondary | ICD-10-CM

## 2015-08-16 DIAGNOSIS — R319 Hematuria, unspecified: Secondary | ICD-10-CM | POA: Diagnosis not present

## 2015-08-16 HISTORY — DX: Other specified abnormal findings of blood chemistry: R79.89

## 2015-08-16 HISTORY — DX: Hyperlipidemia, unspecified: E78.5

## 2015-08-16 LAB — CBC
HEMATOCRIT: 40.8 % (ref 36.0–46.0)
Hemoglobin: 14 g/dL (ref 12.0–15.0)
MCH: 29.2 pg (ref 26.0–34.0)
MCHC: 34.3 g/dL (ref 30.0–36.0)
MCV: 85 fL (ref 78.0–100.0)
MPV: 8.9 fL (ref 8.6–12.4)
PLATELETS: 246 10*3/uL (ref 150–400)
RBC: 4.8 MIL/uL (ref 3.87–5.11)
RDW: 13.8 % (ref 11.5–15.5)
WBC: 11.5 10*3/uL — ABNORMAL HIGH (ref 4.0–10.5)

## 2015-08-16 LAB — TSH: TSH: 2.045 u[IU]/mL (ref 0.350–4.500)

## 2015-08-16 NOTE — Addendum Note (Signed)
Addended by: Yisroel Ramming, Dietrich Pates E on: 08/16/2015 09:09 PM   Modules accepted: Orders

## 2015-08-16 NOTE — Telephone Encounter (Signed)
Left patient a message to call and schedule her 2 week recheck. Patient failed to schedule at check out.

## 2015-08-16 NOTE — Progress Notes (Signed)
Patient ID: Denise Munoz, female   DOB: 1955/08/28, 60 y.o.   MRN: [id number] 60 y.o. [id number] Divorced Caucasian female here for annual exam.   Status post TAH/BSO 07/17/14.  Benign serous cystadenofibroma, fibroids, adenomyosis, and cervix benign.    States she wishes she never had the hysterectomy.  Feels not well since surgery and reports the timing follows her hysterectomy.  Having a lot of abdominal pain.  "Abdomen still feels bruised." Has constipation, bilateral cramping, and bloating. Feels pain to stand up for a long period of time.   Reports vaginal bleeding since her hysterectomy. Having bleeding on her underwear and can occur daily. Feels it occurs more when she is more active.  Can even have clotting from the vagina.  Feels like she cannot completely empty her bladder.  Some leakage of urine with sneeze if she has taken Advil.  Feels cold, fatigue, dry skin.   Feels emotional changes after hysterectomy also.  Asking about hormonal therapy.  LMP was 10 years ago.  Never had hot flashes. Tired and tired of not feeling well.  No energy.  Not feeling interested in activities. Not suicidal.   Hx of elevated WBC.   Had post op CT scan done following hysterectomy.  CLINICAL DATA: One week status post hysterectomy and BSO. Abdominal swelling. Fluid collection at surgical site, initial encounter.  EXAM: CT ABDOMEN AND PELVIS WITH CONTRAST  TECHNIQUE: Multidetector CT imaging of the abdomen and pelvis was performed using the standard protocol following bolus administration of intravenous contrast.  CONTRAST: 141m OMNIPAQUE IOHEXOL 300 MG/ML SOLN  COMPARISON: None.  FINDINGS: Lower Chest: Unremarkable.  Hepatobiliary: Diffuse hepatic steatosis noted, however no liver masses are identified. Gallbladder is unremarkable.  Pancreas: No mass, inflammatory changes, or other significant abnormality identified.  Spleen: Within normal limits in size and  appearance.  Adrenals/Urinary Tract: No masses identified. No evidence of hydronephrosis.  Stomach/Bowel/Peritoneum: No evidence of wall thickening, mass, or obstruction. Small hiatal hernia noted. Mild diverticulosis also seen, without evidence of diverticulitis.  Vascular/Lymphatic: No pathologically enlarged lymph nodes identified. No other significant abnormality visualized.  Reproductive: Uterus is surgically absent. A rim enhancing fluid collection is seen in the hysterectomy bed which measures 4.1 x 6.5 cm. No other abdominal or pelvic fluid collections identified. No evidence of free intraperitoneal fluid or air.  Other: None.  Musculoskeletal: No suspicious bone lesions identified.  IMPRESSION: 4 x 6 cm postop fluid collection in the pelvic hysterectomy bed. Abscess cannot be excluded. No other complication identified.  Incidental findings including hepatic steatosis, small hiatal hernia, and diverticulosis.   Electronically Signed  By: JEarle GellM.D.  On: 07/25/2014 09:38  PCP:  VMilagros Evener MD  Patient's last menstrual period was 05/13/2006.          Sexually active: No. female The current method of family planning is post menopausal status and TAH/BSO on 07/17/14.    Exercising: Yes.    walks and runs. Smoker:  Former smoker;quit 2011  Health Maintenance: Pap:  05-21-14 Neg:Neg HR HPV History of abnormal Pap:  Yes, 1985 hx of colposcopy and ?cryotherapy to cervix. MMG:  07-10-14 Density Cat.B/Neg/BiRads1:[facility].   Colonoscopy:  NEVER BMD:   n/a  Result  n/a TDaP:  Up to date with PCP--??2013 Screening Labs:  Hb today: 13.9, Urine today: Trace RBC's--pt. States having vag. spotting   reports that she quit smoking about 5 years ago. She has never used smokeless tobacco. She reports that she does not drink alcohol or use  illicit drugs.  Past Medical History  Diagnosis Date  . Arthritis   . Leukocytosis   . Scarlet fever   .  Leukocytosis 05/17/2014  . Abnormal Pap smear of cervix ~1986  . History of mumps as a child   . Headache     history of migraines  . Fibromyalgia   . Blood dyscrasia     elevated white blood cells-but rheumatologist said no disorder    Past Surgical History  Procedure Laterality Date  . Tonsillectomy    . Colposcopy  ~1986    Normal  . Abdominal hysterectomy N/A 07/17/2014    Procedure: HYSTERECTOMY ABDOMINAL;  Surgeon: Jamey Reas de Berton Lan, MD;  Location: [facility];  Service: Gynecology;  Laterality: N/A;  . Salpingoophorectomy Bilateral 07/17/2014    Procedure: SALPINGO OOPHORECTOMY with collection of pelvic washings;  Surgeon: Jamey Reas de Berton Lan, MD;  Location: [facility];  Service: Gynecology;  Laterality: Bilateral;    Current Outpatient Prescriptions  Medication Sig Dispense Refill  . acetaminophen (TYLENOL) 500 MG tablet Take 500 mg by mouth every 6 (six) hours as needed. Patient takes 500-1,000 mg prn    . ibuprofen (ADVIL,MOTRIN) 200 MG tablet Take 200 mg by mouth every 6 (six) hours as needed. Patient takes 200-400 mg prn    . Multiple Vitamin (MULTIVITAMIN) tablet Take 1 tablet by mouth daily.     No current facility-administered medications for this visit.    Family History  Problem Relation Age of Onset  . Breast cancer Mother 40  . Alzheimer's disease Father 27  . Breast cancer Maternal Grandmother 70    ROS:  Pertinent items are noted in HPI.  Otherwise, a comprehensive ROS was negative.  Exam:   Ht 5' 1.5" (1.562 m)  Wt 207 lb (93.895 kg)  BMI 38.48 kg/m2  LMP 05/13/2006    General appearance: alert, cooperative and appears stated age.  Tearful at times. Head: Normocephalic, without obvious abnormality, atraumatic Neck: no adenopathy, supple, symmetrical, trachea midline and thyroid normal to inspection and palpation Lungs: clear to auscultation bilaterally Breasts: normal appearance, no masses or tenderness, Inspection negative,  No nipple retraction or dimpling, No nipple discharge or bleeding, No axillary or supraclavicular adenopathy Heart: regular rate and rhythm Abdomen: vertical midline incision, soft, non-tender; bowel sounds normal; no masses,  no organomegaly Extremities: extremities normal, atraumatic, no cyanosis or edema Skin: Skin color, texture, turgor normal. No rashes or lesions Lymph nodes: Cervical, supraclavicular, and axillary nodes normal. No abnormal inguinal nodes palpated Neurologic: Grossly normal  Pelvic: External genitalia:  no lesions              Urethra:  normal appearing urethra with no masses, tenderness or lesions              Bartholins and Skenes: normal                 Vagina: normal appearing vagina with normal color and discharge, no lesions              Cervix: absent and granulation tissue present at apex - treated with AgNO3.              Pap taken: Yes.   Bimanual Exam:  Uterus:  uterus absent              Adnexa: no mass, fullness, tenderness              Rectovaginal: Yes.  .  Confirms.  Anus:  normal sphincter tone, no lesions  Chaperone was present for exam.  Assessment:   Well woman visit. Postmenopausal since 2007 Status post TAH/BSO. Vaginal spotting likely from vaginal granulation tissue.  Hx of abnormal paps. Microscopic hematuria. Abdominal pain and bloating.  Possible constipation, adhesive disease, diverticular disease. Cold intolerance.  Malaise and fatigue.  Emotional changes. FH of breast cancer.   Plan: Yearly mammogram recommended after age 89.   I recommended and strongly encouraged patient to do her mammogram.   Recommended self breast exam.  Pap and HR HPV as above. Recommendations for Calcium, Vitamin D, regular exercise program including cardiovascular and weight bearing exercise. Labs performed.  Yes.    CBC, CMP, TSH, Vit D, lipids, urine micro and culture.  Refills given on medications.  No..   Follow up annually and prn.    After visit summary provided.   ___25____ additional minutes face to face time of which over 50% was spent in counseling.  I spent one hour face to face with patient doing her annual exam and counseling regarding her abdominal bloating, abdominal pain, cold intolerance, emotional changes, malaise and fatigue, vaginal bleeding/granulation tissue, hematuria.  I am recommending that the patient proceed with a CT of the abdomen and pelvis and have a GI consultation.  I have also discussed with her possible consultation with a therapist as she is also exhibiting some signs of potential depression.  She declines this at this time.  I have asked her to return in 2 weeks to review her test results and future CT scan results so that we can begin ruling out important causes of her multiple symptoms that may or may not be attributed to her hysterectomy procedure.

## 2015-08-17 ENCOUNTER — Encounter: Payer: Self-pay | Admitting: Obstetrics and Gynecology

## 2015-08-17 ENCOUNTER — Other Ambulatory Visit: Payer: Self-pay | Admitting: Obstetrics and Gynecology

## 2015-08-17 DIAGNOSIS — R7989 Other specified abnormal findings of blood chemistry: Secondary | ICD-10-CM

## 2015-08-17 LAB — COMPREHENSIVE METABOLIC PANEL
ALT: 29 U/L (ref 6–29)
AST: 38 U/L — ABNORMAL HIGH (ref 10–35)
Albumin: 3.8 g/dL (ref 3.6–5.1)
Alkaline Phosphatase: 124 U/L (ref 33–130)
BILIRUBIN TOTAL: 0.3 mg/dL (ref 0.2–1.2)
BUN: 13 mg/dL (ref 7–25)
CO2: 18 mmol/L — ABNORMAL LOW (ref 20–31)
Calcium: 9.1 mg/dL (ref 8.6–10.4)
Chloride: 104 mmol/L (ref 98–110)
Creat: 0.55 mg/dL (ref 0.50–0.99)
GLUCOSE: 82 mg/dL (ref 65–99)
Potassium: 3.8 mmol/L (ref 3.5–5.3)
Sodium: 142 mmol/L (ref 135–146)
Total Protein: 7.2 g/dL (ref 6.1–8.1)

## 2015-08-17 LAB — URINALYSIS, MICROSCOPIC ONLY
BACTERIA UA: NONE SEEN [HPF]
Casts: NONE SEEN [LPF]
Crystals: NONE SEEN [HPF]
RBC / HPF: NONE SEEN RBC/HPF (ref <=2)
SQUAMOUS EPITHELIAL / LPF: NONE SEEN [HPF] (ref <=5)
WBC, UA: NONE SEEN WBC/HPF (ref <=5)
Yeast: NONE SEEN [HPF]

## 2015-08-17 LAB — URINE CULTURE
Colony Count: NO GROWTH
Organism ID, Bacteria: NO GROWTH

## 2015-08-17 LAB — LIPID PANEL
Cholesterol: 200 mg/dL (ref 125–200)
HDL: 37 mg/dL — ABNORMAL LOW (ref >=46)
LDL CALC: 101 mg/dL (ref <130)
Total CHOL/HDL Ratio: 5.4 Ratio — ABNORMAL HIGH (ref <=5.0)
Triglycerides: 308 mg/dL — ABNORMAL HIGH (ref <150)
VLDL: 62 mg/dL — ABNORMAL HIGH (ref <30)

## 2015-08-17 LAB — VITAMIN D 25 HYDROXY (VIT D DEFICIENCY, FRACTURES): VIT D 25 HYDROXY: 16 ng/mL — AB (ref 30–100)

## 2015-08-17 MED ORDER — VITAMIN D (ERGOCALCIFEROL) 1.25 MG (50000 UNIT) PO CAPS: 50000.0000 [IU] | ORAL_CAPSULE | ORAL | Status: DC

## 2015-08-20 NOTE — Telephone Encounter (Signed)
Left message regarding scheduling 2 week recheck with Dr.Silva. Patient is due approximately 08/30/15.

## 2015-08-21 ENCOUNTER — Telehealth: Payer: Self-pay

## 2015-08-21 ENCOUNTER — Encounter: Payer: Self-pay | Admitting: Internal Medicine

## 2015-08-21 LAB — IPS PAP TEST WITH HPV

## 2015-08-21 NOTE — Telephone Encounter (Signed)
Called patient at [phone] to confirm she had received her lab results and recommendations from [place], left message on voicemail for her to call me back.

## 2015-08-21 NOTE — Telephone Encounter (Signed)
See result note--patient has reviewed lab results/recommendations from Dr. Quincy Simmonds on [place].

## 2015-08-21 NOTE — Telephone Encounter (Signed)
-----   Message from Nunzio Cobbs, MD sent at 08/19/2015  9:30 AM EST ----- Results to patient through My Chart. UC negative.

## 2015-08-21 NOTE — Telephone Encounter (Signed)
Patient returning call.

## 2015-08-27 NOTE — Telephone Encounter (Signed)
Left another message for patient to call and schedule her 2 week recheck appointment.

## 2015-08-28 NOTE — Telephone Encounter (Signed)
I have closed the encounter.  No further calls are needed. Patient was personally counseled and advised at her office visit.

## 2015-08-28 NOTE — Telephone Encounter (Signed)
Three calls with no response from this patient to schedule her 2 week recheck.

## 2015-09-10 ENCOUNTER — Other Ambulatory Visit: Payer: Self-pay

## 2015-09-10 DIAGNOSIS — Z1231 Encounter for screening mammogram for malignant neoplasm of breast: Secondary | ICD-10-CM

## 2015-09-12 ENCOUNTER — Other Ambulatory Visit (HOSPITAL_COMMUNITY): Payer: Self-pay | Admitting: Respiratory Therapy

## 2015-09-12 DIAGNOSIS — J45990 Exercise induced bronchospasm: Secondary | ICD-10-CM

## 2015-09-18 ENCOUNTER — Ambulatory Visit
Admission: RE | Admit: 2015-09-18 | Discharge: 2015-09-18 | Disposition: A | Discharge status: Home / Self Care | Payer: BC Managed Care – PPO | Source: Ambulatory Visit | Attending: Family Medicine | Admitting: Family Medicine

## 2015-09-18 ENCOUNTER — Other Ambulatory Visit: Payer: Self-pay | Admitting: Family Medicine

## 2015-09-18 DIAGNOSIS — R062 Wheezing: Secondary | ICD-10-CM

## 2015-09-23 ENCOUNTER — Encounter (HOSPITAL_COMMUNITY): Payer: BC Managed Care – PPO

## 2015-09-24 ENCOUNTER — Telehealth: Payer: Self-pay | Admitting: Obstetrics and Gynecology

## 2015-09-24 NOTE — Telephone Encounter (Signed)
[employer] had initially advised CT Abdomin/Pelvis w/contrast was not authorized.  Dr Quincy Simmonds had a peer to peer reviewed today and based on that review CT has now been approved.  Authorization #[id number] and is valid through 10/23/15.  I have conveyed this information to [facility] at [place]

## 2015-09-25 ENCOUNTER — Ambulatory Visit
Admission: RE | Admit: 2015-09-25 | Discharge: 2015-09-25 | Disposition: A | Discharge status: Home / Self Care | Payer: BC Managed Care – PPO | Source: Ambulatory Visit | Attending: Obstetrics and Gynecology | Admitting: Obstetrics and Gynecology

## 2015-09-25 DIAGNOSIS — K5792 Diverticulitis of intestine, part unspecified, without perforation or abscess without bleeding: Secondary | ICD-10-CM

## 2015-09-25 DIAGNOSIS — R109 Unspecified abdominal pain: Secondary | ICD-10-CM

## 2015-09-25 DIAGNOSIS — R140 Abdominal distension (gaseous): Secondary | ICD-10-CM

## 2015-09-25 HISTORY — DX: Diverticulitis of intestine, part unspecified, without perforation or abscess without bleeding: K57.92

## 2015-09-25 MED ORDER — IOPAMIDOL (ISOVUE-300) INJECTION 61%
100.0000 mL | Freq: Once | INTRAVENOUS | Status: AC | PRN
  Administered 2015-09-25: 100 mL via INTRAVENOUS

## 2015-09-26 ENCOUNTER — Telehealth: Payer: Self-pay | Admitting: Emergency Medicine

## 2015-09-26 ENCOUNTER — Telehealth: Payer: Self-pay | Admitting: Internal Medicine

## 2015-09-26 MED ORDER — CIPROFLOXACIN HCL 500 MG PO TABS: 500.0000 mg | ORAL_TABLET | Freq: Two times a day (BID) | ORAL | Status: DC

## 2015-09-26 MED ORDER — METRONIDAZOLE 500 MG PO TABS: 500.0000 mg | ORAL_TABLET | Freq: Three times a day (TID) | ORAL | Status: DC

## 2015-09-26 NOTE — Telephone Encounter (Signed)
Noted message from Dr. Carlean Purl routed by RN Barb Merino. Message copied and sent to Dr. Quincy Simmonds.  Thank you!

## 2015-09-26 NOTE — Telephone Encounter (Signed)
As long as she feels fully recovered that should be fine.  Have the patient let us know if she is still having sxs after the Abx and we can evaluate and treat/reschedule if needed.  Odds are she will be ok.  Thanks for the heads up to her gynecologist.

## 2015-09-26 NOTE — Telephone Encounter (Signed)
-----   Message from Nunzio Cobbs, MD sent at 09/25/2015  1:05 PM EDT ----- Please contact patient with CT scan results.  She has diverticulitis.  I am recommending that this is treated with abx and I am suggesting: Cipro 500 mg po bid and Flagyl 500 mg q 8 hours - both for 10 days.  Her GI needs to know of these results, as she is planning on an upcoming colonoscopy.  It would be good to know if they will approve these medications or if they are recommending something different.  I will ask them to follow her for this and not this office.   CT also had some other nonspecific findings - mild cardiomegaly, fatty liver, hepatomegaly (enlarged liver), reactive enlarged lymph node, atherosclerosis.   No fluid collection was seen in the pelvis.

## 2015-09-26 NOTE — Telephone Encounter (Signed)
Dr. Carlean Purl patient with new diagnosis of diverticulitis.  She was prescribed Cipro 500 BID and flagyl 250 TID for 10 days by her GYN.  She is also scheuled for colon on 11/08/15.  They would like you to advise if if the antibiotics they prescribed would be adequate and assurance she can keep her procedure as scheduled for 11/08/15.

## 2015-09-26 NOTE — Telephone Encounter (Signed)
Call to patient and discussed results from Dr. Quincy Simmonds and message from Dr. Carlean Purl. Patient advised to call [facility] if she has any concerns or symptoms after treatment with Cipro and Flagyl.  Patient given instructions regarding use of both Cipro and Flagyl. Alcohol precautions given for Flagyl and patient verbalized understanding.   Patient states she has started a new walking/running regimen that she is doing that is progressive in activity. She wanted to know if walking and running was safe to do while using Cipro due to risk of achilles tendon rupture and with arthrosclerosis on CT Scan.   Advised patient that if she is currently on this program and doing well can continue activity as tolerated. Advised to call back if develops any pain in tendons and to avoid strenuous activity.   Advised will request Dr. Quincy Simmonds review and advise if any additional instructions. Patient agreeable.

## 2015-09-26 NOTE — Telephone Encounter (Signed)
I agree with your recommendations and thank you for facilitating communication with this patient.  You may close the encounter.

## 2015-09-26 NOTE — Telephone Encounter (Signed)
Call to Dr. Celesta Aver office to request message be sent for review of CT Abd/Pelvis:   Message from Dr. Carlean Purl:  Call Documentation      Denise Mayer, MD at 09/26/2015 12:57 PM     Status: Signed       Expand All Collapse All   As long as she feels fully recovered that should be fine.  Have the patient let us know if she is still having sxs after the Abx and we can evaluate and treat/reschedule if needed.  Odds are she will be ok.  Thanks for the heads up to her gynecologist.

## 2015-09-27 ENCOUNTER — Ambulatory Visit: Payer: BC Managed Care – PPO

## 2015-09-27 NOTE — Telephone Encounter (Signed)
Noted message from Dr. Quincy Simmonds.  Will close encounter.

## 2015-09-30 ENCOUNTER — Telehealth: Payer: Self-pay | Admitting: Obstetrics and Gynecology

## 2015-09-30 MED ORDER — VALACYCLOVIR HCL 1 G PO TABS: ORAL_TABLET | ORAL | Status: DC

## 2015-09-30 NOTE — Telephone Encounter (Signed)
Spoke with patient. Patient states that the day after she started taking Cipro and Flagyl for her diverticulitis she began to develop cold sores around her mouth. Reports she has been using Abreva without relief. 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 I am not sure if it is because of the medication or the diverticulitis. I have a ring of cold sores around my mouth. I usually only get one at a time." States she has used Valtrex before with relief of symptoms. Advised I will speak with Dr.SIlva regarding symptoms and return call with additional recommendations. She is agreeable. Pharmacy on file is correct.

## 2015-09-30 NOTE — Telephone Encounter (Signed)
Ok for Valtrex 1000 mg, take 2 tablet every 12 hours x 2 doses.  Dispense:  30, RF zero.

## 2015-09-30 NOTE — Telephone Encounter (Signed)
Patient wants to speak with the nurse no information given.

## 2015-09-30 NOTE — Telephone Encounter (Signed)
Spoke with patient. Advised of message as seen below from Bertrand. She is agreeable and verbalizes understanding. Rx for Valtrex 1000 mg, take 2 tablets q 12 hours x 2 doses #30 0RF sent to [facility] off [address]. She is agreeable.  Routing to provider for final review. Patient agreeable to disposition. Will close encounter.

## 2015-10-18 ENCOUNTER — Ambulatory Visit (AMBULATORY_SURGERY_CENTER): Payer: Self-pay

## 2015-10-18 ENCOUNTER — Ambulatory Visit
Admission: RE | Admit: 2015-10-18 | Discharge: 2015-10-18 | Disposition: A | Discharge status: Home / Self Care | Payer: BC Managed Care – PPO | Source: Ambulatory Visit

## 2015-10-18 VITALS — Ht 62.0 in | Wt 204.8 lb

## 2015-10-18 DIAGNOSIS — Z1231 Encounter for screening mammogram for malignant neoplasm of breast: Secondary | ICD-10-CM

## 2015-10-18 DIAGNOSIS — Z1211 Encounter for screening for malignant neoplasm of colon: Secondary | ICD-10-CM

## 2015-10-22 ENCOUNTER — Other Ambulatory Visit: Payer: Self-pay | Admitting: Obstetrics and Gynecology

## 2015-10-22 DIAGNOSIS — R928 Other abnormal and inconclusive findings on diagnostic imaging of breast: Secondary | ICD-10-CM

## 2015-10-25 ENCOUNTER — Ambulatory Visit
Admission: RE | Admit: 2015-10-25 | Discharge: 2015-10-25 | Disposition: A | Discharge status: Home / Self Care | Payer: BC Managed Care – PPO | Source: Ambulatory Visit | Attending: Obstetrics and Gynecology | Admitting: Obstetrics and Gynecology

## 2015-10-25 DIAGNOSIS — R928 Other abnormal and inconclusive findings on diagnostic imaging of breast: Secondary | ICD-10-CM

## 2015-11-04 ENCOUNTER — Other Ambulatory Visit (HOSPITAL_COMMUNITY): Payer: Self-pay | Admitting: Respiratory Therapy

## 2015-11-04 DIAGNOSIS — J45990 Exercise induced bronchospasm: Secondary | ICD-10-CM

## 2015-11-05 ENCOUNTER — Other Ambulatory Visit: Payer: Self-pay | Admitting: Obstetrics and Gynecology

## 2015-11-05 NOTE — Telephone Encounter (Signed)
Patient needs a recheck of her vit D before any refills.  I am declining Rx. Thanks!

## 2015-11-05 NOTE — Telephone Encounter (Signed)
Medication refill request: Vitamin D, Ergocalciferol 50000units Last AEX:  08/16/15 Dr. Quincy Simmonds Next AEX: Not scheduled for next AEX Last MMG (if hormonal medication request): No hormonal medication requested Refill authorized: 08/17/15 #12 w/ 0 refills today please advise. Does patient need vitamin d level rechecked before refill?

## 2015-11-06 NOTE — Telephone Encounter (Signed)
Called patient to schedule lab visit for Vitamin D level, no answer, left VM to call back.

## 2015-11-08 ENCOUNTER — Ambulatory Visit (AMBULATORY_SURGERY_CENTER): Payer: BC Managed Care – PPO | Admitting: Internal Medicine

## 2015-11-08 ENCOUNTER — Encounter: Payer: Self-pay | Admitting: Internal Medicine

## 2015-11-08 VITALS — BP 109/83 | HR 71 | Temp 97.1°F | Resp 10 | Ht 62.0 in | Wt 204.0 lb

## 2015-11-08 DIAGNOSIS — D122 Benign neoplasm of ascending colon: Secondary | ICD-10-CM

## 2015-11-08 DIAGNOSIS — K629 Disease of anus and rectum, unspecified: Secondary | ICD-10-CM

## 2015-11-08 DIAGNOSIS — K6289 Other specified diseases of anus and rectum: Secondary | ICD-10-CM | POA: Diagnosis not present

## 2015-11-08 DIAGNOSIS — K5190 Ulcerative colitis, unspecified, without complications: Secondary | ICD-10-CM | POA: Diagnosis not present

## 2015-11-08 DIAGNOSIS — K635 Polyp of colon: Secondary | ICD-10-CM | POA: Diagnosis not present

## 2015-11-08 DIAGNOSIS — Z1211 Encounter for screening for malignant neoplasm of colon: Secondary | ICD-10-CM | POA: Diagnosis present

## 2015-11-08 MED ORDER — SODIUM CHLORIDE 0.9 % IV SOLN: 500.0000 mL | INTRAVENOUS | Status: DC

## 2015-11-08 NOTE — Op Note (Signed)
[facility] Patient Name: Denise Munoz Procedure Date: 11/08/2015 11:25 AM MRN: [id number] Endoscopist: Gatha Mayer , MD Age: 60 Date of Birth: 23-Dec-1955 Gender: Female Procedure:                Colonoscopy Indications:              Screening for colorectal malignant neoplasm, This                            is the patient's first colonoscopy Medicines:                Propofol per Anesthesia, Monitored Anesthesia Care Procedure:                Pre-Anesthesia Assessment:                           - Prior to the procedure, a History and Physical                            was performed, and patient medications and                            allergies were reviewed. The patient's tolerance of                            previous anesthesia was also reviewed. The risks                            and benefits of the procedure and the sedation                            options and risks were discussed with the patient.                            All questions were answered, and informed consent                            was obtained. Prior Anticoagulants: The patient has                            taken no previous anticoagulant or antiplatelet                            agents. ASA Grade Assessment: II - A patient with                            mild systemic disease. After reviewing the risks                            and benefits, the patient was deemed in                            satisfactory condition to undergo the procedure.  After obtaining informed consent, the colonoscope                            was passed under direct vision. Throughout the                            procedure, the patient's blood pressure, pulse, and                            oxygen saturations were monitored continuously. The                            Model CF-HQ190L [id number]) scope was introduced                            through the anus and advanced to the  the cecum,                            identified by appendiceal orifice and ileocecal                            valve. The colonoscopy was performed without                            difficulty. The patient tolerated the procedure                            well. The quality of the bowel preparation was                            excellent. The bowel preparation used was Miralax.                            The ileocecal valve, appendiceal orifice, and                            rectum were photographed. Scope In: 11:44:28 AM Scope Out: 11:58:13 AM Scope Withdrawal Time: 0 hours 11 minutes 29 seconds  Total Procedure Duration: 0 hours 13 minutes 45 seconds  Findings:                 The perianal and digital rectal examinations were                            normal.                           A 5 mm polyp was found in the ascending colon. The                            polyp was sessile. The polyp was removed with a                            cold snare. Resection and retrieval were complete.  Verification of patient identification for the                            specimen was done. Estimated blood loss was minimal.                           Diffuse mild inflammation characterized by                            erosions, erythema, friability, granularity, loss                            of vascularity and mucus was found in the rectum.                            Biopsies were taken with a cold forceps for                            histology. Verification of patient identification                            for the specimen was done. Estimated blood loss was                            minimal.                           Multiple diverticula were found in the sigmoid                            colon. There was no evidence of diverticular                            bleeding.                           Unable to retroflex in rectum - narrow vault. Complications:             No immediate complications. Estimated Blood Loss:     Estimated blood loss was minimal. Impression:               - One 5 mm polyp in the ascending colon, removed                            with a cold snare. Resected and retrieved.                           - Diffuse mild inflammation was found in the rectum                            secondary to proctitis. Biopsied.                           - Moderate diverticulosis in the sigmoid colon.  There was no evidence of diverticular bleeding. Recommendation:           - Patient has a contact number available for                            emergencies. The signs and symptoms of potential                            delayed complications were discussed with the                            patient. Return to normal activities tomorrow.                            Written discharge instructions were provided to the                            patient.                           - Resume previous diet.                           - Continue present medications.                           - Await pathology results.                           - Repeat colonoscopy is recommended. The                            colonoscopy date will be determined after pathology                            results from today's exam become available for                            review. Gatha Mayer, MD 11/08/2015 12:05:59 PM This report has been signed electronically. CC Letter to:             Brook A. Cyndie Mull R. Rankins

## 2015-11-08 NOTE — Patient Instructions (Addendum)
I removed one tiny polyp. I will let you know pathology results and when to have another routine colonoscopy by mail. It looks like you have rectal inflammation - called proctitis. I took biopsies.  I appreciate the opportunity to care for you. Gatha Mayer, MD, FACG  YOU HAD AN ENDOSCOPIC PROCEDURE TODAY AT [facility]:   Refer to the procedure report that was given to you for any specific questions about what was found during the examination.  If the procedure report does not answer your questions, please call your gastroenterologist to clarify.  If you requested that your care partner not be given the details of your procedure findings, then the procedure report has been included in a sealed envelope for you to review at your convenience later.  YOU SHOULD EXPECT: Some feelings of bloating in the abdomen. Passage of more gas than usual.  Walking can help get rid of the air that was put into your GI tract during the procedure and reduce the bloating. If you had a lower endoscopy (such as a colonoscopy or flexible sigmoidoscopy) you may notice spotting of blood in your stool or on the toilet paper. If you underwent a bowel prep for your procedure, you may not have a normal bowel movement for a few days.  Please Note:  You might notice some irritation and congestion in your nose or some drainage.  This is from the oxygen used during your procedure.  There is no need for concern and it should clear up in a day or so.  SYMPTOMS TO REPORT IMMEDIATELY:   Following lower endoscopy (colonoscopy or flexible sigmoidoscopy):  Excessive amounts of blood in the stool  Significant tenderness or worsening of abdominal pains  Swelling of the abdomen that is new, acute  Fever of 100F or higher   For urgent or emergent issues, a gastroenterologist can be reached at any hour by calling [phone].   DIET: Your first meal following the procedure should be a small meal and  then it is ok to progress to your normal diet. Heavy or fried foods are harder to digest and may make you feel nauseous or bloated.  Likewise, meals heavy in dairy and vegetables can increase bloating.  Drink plenty of fluids but you should avoid alcoholic beverages for 24 hours.  ACTIVITY:  You should plan to take it easy for the rest of today and you should NOT DRIVE or use heavy machinery until tomorrow (because of the sedation medicines used during the test).    FOLLOW UP: Our staff will call the number listed on your records the next business day following your procedure to check on you and address any questions or concerns that you may have regarding the information given to you following your procedure. If we do not reach you, we will leave a message.  However, if you are feeling well and you are not experiencing any problems, there is no need to return our call.  We will assume that you have returned to your regular daily activities without incident.  If any biopsies were taken you will be contacted by phone or by letter within the next 1-3 weeks.  Please call us at [phone] if you have not heard about the biopsies in 3 weeks.    SIGNATURES/CONFIDENTIALITY: You and/or your care partner have signed paperwork which will be entered into your electronic medical record.  These signatures attest to the fact that that the information above on your  After Visit Summary has been reviewed and is understood.  Full responsibility of the confidentiality of this discharge information lies with you and/or your care-partner.  Polyp-handout given

## 2015-11-08 NOTE — Progress Notes (Signed)
Patient awakening,vss,report to rn 

## 2015-11-08 NOTE — Progress Notes (Signed)
Called to room to assist during endoscopic procedure.  Patient ID and intended procedure confirmed with present staff. Received instructions for my participation in the procedure from the performing physician.  

## 2015-11-11 ENCOUNTER — Telehealth: Payer: Self-pay | Admitting: *Deleted

## 2015-11-11 NOTE — Telephone Encounter (Signed)
Name identifier, left message, follow-up

## 2015-11-21 NOTE — Progress Notes (Signed)
Quick Note:  Mucosal polyp Mild proctitis NOT IBD Colon recall 2027 I sent a My Chart message - no letter ______

## 2015-12-23 ENCOUNTER — Telehealth: Payer: Self-pay

## 2015-12-23 NOTE — Telephone Encounter (Signed)
-----   Message from Nunzio Cobbs, MD sent at 12/20/2015  8:15 AM EDT ----- Please contact patient that she is due for vit D recheck.   She came up in my EPIC reminders.  Thank you,   Brook ----- Message -----    From: SYSTEM    Sent: 12/20/2015  12:05 AM      To: Nunzio Cobbs, MD

## 2015-12-23 NOTE — Telephone Encounter (Signed)
Called patient at [phone] to have her make lab appointment to recheck vitamin D level, left message on voicemail to call me.

## 2015-12-23 NOTE — Telephone Encounter (Signed)
Patient returned my call and scheduled lab appointment to recheck Vitamin D level on 12-25-15 at 8:30am.

## 2015-12-25 ENCOUNTER — Other Ambulatory Visit (INDEPENDENT_AMBULATORY_CARE_PROVIDER_SITE_OTHER): Payer: BC Managed Care – PPO

## 2015-12-25 DIAGNOSIS — R7989 Other specified abnormal findings of blood chemistry: Secondary | ICD-10-CM

## 2015-12-26 ENCOUNTER — Other Ambulatory Visit: Payer: Self-pay | Admitting: Obstetrics and Gynecology

## 2015-12-26 DIAGNOSIS — E559 Vitamin D deficiency, unspecified: Secondary | ICD-10-CM

## 2015-12-26 LAB — VITAMIN D 25 HYDROXY (VIT D DEFICIENCY, FRACTURES): VIT D 25 HYDROXY: 26 ng/mL — AB (ref 30–100)

## 2015-12-26 MED ORDER — VITAMIN D (ERGOCALCIFEROL) 1.25 MG (50000 UNIT) PO CAPS: 50000.0000 [IU] | ORAL_CAPSULE | ORAL | Status: DC

## 2015-12-30 ENCOUNTER — Telehealth: Payer: Self-pay | Admitting: Obstetrics and Gynecology

## 2015-12-30 NOTE — Telephone Encounter (Signed)
Please disregard made in error

## 2016-01-06 ENCOUNTER — Ambulatory Visit (INDEPENDENT_AMBULATORY_CARE_PROVIDER_SITE_OTHER): Payer: BC Managed Care – PPO | Admitting: Family Medicine

## 2016-01-06 ENCOUNTER — Encounter: Payer: Self-pay | Admitting: Family Medicine

## 2016-01-06 VITALS — BP 126/90 | HR 101 | Temp 98.0°F | Ht 62.0 in | Wt 206.6 lb

## 2016-01-06 DIAGNOSIS — R5383 Other fatigue: Secondary | ICD-10-CM | POA: Diagnosis not present

## 2016-01-06 DIAGNOSIS — D72829 Elevated white blood cell count, unspecified: Secondary | ICD-10-CM

## 2016-01-06 DIAGNOSIS — E785 Hyperlipidemia, unspecified: Secondary | ICD-10-CM

## 2016-01-06 DIAGNOSIS — M791 Myalgia, unspecified site: Secondary | ICD-10-CM

## 2016-01-06 DIAGNOSIS — F330 Major depressive disorder, recurrent, mild: Secondary | ICD-10-CM

## 2016-01-06 DIAGNOSIS — M2550 Pain in unspecified joint: Secondary | ICD-10-CM | POA: Diagnosis not present

## 2016-01-06 DIAGNOSIS — G8929 Other chronic pain: Secondary | ICD-10-CM

## 2016-01-06 DIAGNOSIS — E559 Vitamin D deficiency, unspecified: Secondary | ICD-10-CM

## 2016-01-06 DIAGNOSIS — K7581 Nonalcoholic steatohepatitis (NASH): Secondary | ICD-10-CM

## 2016-01-06 LAB — POCT GLYCOSYLATED HEMOGLOBIN (HGB A1C): Hemoglobin A1C: 5.4

## 2016-01-06 NOTE — Progress Notes (Signed)
HPI:  Denise Munoz is here to establish care. From [place], used to see Dr. Joni Fears, then moved away. Was with [facility] physicians and not happy with the model of one symptom at a time. She has seen many specialists in the past and has had extensive evaluations for several chronic issues. Last PCP and physical: does yearly exam with Dr. Quincy Simmonds in gyn.  Has the following chronic problems that require follow up and concerns today:  Chronic muscle soreness/and arthralgia/chronic abdominal pain and bloating: -intermittent fatigue and pain throughout her muscles and joints - can feel well for several months, then has flares of symptoms -sometimes exertional symptoms, sometimes not -Reports had eval with rheumatologist for lyme and inflammatory disease and was told has OA and ? fibromyalgia -also saw oncology and and told needs to lose weight -she was not happy with prior evaluations and dx of fibromyalgia -hx of depression and anxiety that she feels is a result of feeling unwell rather than the cause of her feeling sick -vit D low -chronic abd pain and bloating since hysterectomy, has had eval with gyn and GI and CT scan -trying to do vegetarian diet, eats a lot sugar and dairy -she decline ID or sleep apnea eval in the past  Atherosclerosis of Aorta/hyperlipidemia/obesity.Fatty liver: -Incidental finding on CT scan of abdomen -She wants to check for diabetes as feels this is the one thing that nobody has checked for -lots of dairy products and sugar in diet -no regular exercise currently  Vit D deficiency: -Dr. Quincy Simmonds treating  Chronic leukocytosis: -eval with onc and felt given chronicity was likely reactive from obesity and OA per review prior notes   ROS negative for unless reported above: fevers, unintentional weight loss, hearing or vision loss, chest pain, palpitations, struggling to breath, hemoptysis, melena, hematochezia, hematuria, falls, loc, si, thoughts of self  harm  Past Medical History  Diagnosis Date  . Arthritis     saw Dr. Charlestine Night for polyarthralgia and chronic fatigue, ? fibromyalgia  . Leukocytosis     seen by rheumatology and oncology in the past and per pt felt weight related  . Scarlet fever   . Abnormal Pap smear of cervix ~1986    probable cryotherapy to cervix per patient  . History of mumps as a child   . Headache     history of migraines  . Fibromyalgia   . Low serum vitamin D 08/16/15  . Hyperlipidemia 08/16/15  . Diverticulitis 09/25/15    Noted on CT scan    Past Surgical History  Procedure Laterality Date  . Tonsillectomy    . Colposcopy  ~1986    Normal  . Abdominal hysterectomy N/A 07/17/2014    Procedure: for fibroids, HYSTERECTOMY ABDOMINAL;  Surgeon: Jamey Reas de Berton Lan, MD;  Location: [facility];  Service: Gynecology;  Laterality: N/A;  . Salpingoophorectomy Bilateral 07/17/2014    Procedure: SALPINGO OOPHORECTOMY with collection of pelvic washings;  Surgeon: Jamey Reas de Berton Lan, MD;  Location: [facility];  Service: Gynecology;  Laterality: Bilateral;    Family History  Problem Relation Age of Onset  . Breast cancer Mother 48  . Alzheimer's disease Father 60  . Breast cancer Maternal Grandmother 56  . Colon cancer Neg Hx     Social History   Social History  . Marital Status: Divorced    Spouse Name: N/A  . Number of Children: N/A  . Years of Education: N/A   Social History Main  Topics  . Smoking status: Former Smoker -- 0.50 packs/day for 36 years    Quit date: 05/17/2010  . Smokeless tobacco: Never Used  . Alcohol Use: No  . Drug Use: No  . Sexual Activity: Not Currently    Birth Control/ Protection: Post-menopausal, Surgical     Comment: TAH/BSO 09-05-14   Other Topics Concern  . None   Social History Narrative   Work or School: Stage manager Situation:      Spiritual Beliefs:      Lifestyle: admits likes sugar, soda, lots of dairy; no regular  exercise           Current outpatient prescriptions:  .  acetaminophen (TYLENOL) 500 MG tablet, Take 500 mg by mouth every 6 (six) hours as needed. Patient takes 500-1,000 mg prn, Disp: , Rfl:  .  ibuprofen (ADVIL,MOTRIN) 200 MG tablet, Take 200 mg by mouth every 6 (six) hours as needed. Patient takes 200-400 mg prn, Disp: , Rfl:  .  KRILL OIL PO, Take by mouth., Disp: , Rfl:  .  Multiple Vitamin (MULTIVITAMIN) tablet, Take 1 tablet by mouth daily., Disp: , Rfl:  .  valACYclovir (VALTREX) 1000 MG tablet, Take 1,000 mg by mouth as needed (breakouts after taking antibiotics)., Disp: , Rfl:  .  Vitamin D, Ergocalciferol, (DRISDOL) 50000 units CAPS capsule, Take 1 capsule (50,000 Units total) by mouth every 7 (seven) days., Disp: 16 capsule, Rfl: 0  EXAM:  Filed Vitals:   01/06/16 1305  BP: 126/90  Pulse: 101  Temp: 98 F (36.7 C)    Body mass index is 37.78 kg/(m^2).  GENERAL: vitals reviewed and listed above, alert, oriented, appears well hydrated and in no acute distress  HEENT: atraumatic, conjunttiva clear, no obvious abnormalities on inspection of external nose and ears  NECK: no obvious masses on inspection  LUNGS: clear to auscultation bilaterally, no wheezes, rales or rhonchi, good air movement  CV: HRRR, no peripheral edema  MS: moves all extremities without noticeable abnormality  PSYCH: pleasant and cooperative, no obvious depression or anxiety  ASSESSMENT AND PLAN:  Discussed the following assessment and plan: More than 50% of over 45 minutes spent in total in caring for this patient was spent face-to-face with the patient, counseling and/or coordinating care.   Other fatigue - Plan: POC HgB A1c Chronic pain of multiple joints Myalgia -intermittent and chronic -has seen rheumatologist and oncologist and per pt had eval for lyme dz, rheumatological and blood disorders -mood may be contributing -possible mild CME -offered ID referral given her concern with  this starting maybe with tick bite, declined -appears has been offered OSA eval and refuse -declined further onc/rheum f/u -for now opted to work supporting body and mind on by [employer], gentle exercise as permitted, stress management - suggested counseling, may benefit from cymbalta -follow up 3 months  Leukocytosis -seen by onc in the past and felt to be wt related -improved last check  Morbid obesity, unspecified obesity type ([place]) -lifestyle recs  NASH (nonalcoholic steatohepatitis) Hyperlipemia -lifestyle recs -will need to discuss statin therapy if worsening or not improving  Mild episode of recurrent major depressive disorder ([place]) -she was reluctant, though admitted to this -suggested consideration tx with cbt and/or medication  Vitamin D insufficiency -dr. Quincy Simmonds treating  -We reviewed the PMH, PSH, FH, SH, Meds and Allergies. -We provided refills for any medications we will prescribe as needed. -We addressed current concerns per orders and patient instructions. -We  have asked for records for pertinent exams, studies, vaccines and notes from previous providers. -We have advised patient to follow up per instructions below.   -Patient advised to return or notify a doctor immediately if symptoms worsen or persist or new concerns arise.  Patient Instructions  BEFORE YOU LEAVE: -hgba1c POC -follow up in 3 months  We recommend the following healthy lifestyle measures: - eat a healthy whole foods diet consisting of regular small meals composed of vegetables, fruits, beans, nuts, seeds, healthy meats such as white chicken and fish and whole grains.  - avoid sweets, white starchy foods, fried foods, fast food, processed foods, sodas, red meet and other fattening foods.  - get a least 150-300 minutes of aerobic exercise per week.      Colin Benton R.

## 2016-01-06 NOTE — Patient Instructions (Addendum)
BEFORE YOU LEAVE: -hgba1c POC -follow up in 3 months  We recommend the following healthy lifestyle measures: - eat a healthy whole foods diet consisting of regular small meals composed of vegetables, fruits, beans, nuts, seeds, healthy meats such as white chicken and fish and whole grains.  - avoid sweets, white starchy foods, fried foods, fast food, processed foods, sodas, red meet and other fattening foods.  - get a least 150-300 minutes of aerobic exercise per week.

## 2016-01-06 NOTE — Progress Notes (Signed)
Pre visit review using our clinic review tool, if applicable. No additional management support is needed unless otherwise documented below in the visit note. 

## 2016-01-07 DIAGNOSIS — K7581 Nonalcoholic steatohepatitis (NASH): Secondary | ICD-10-CM | POA: Insufficient documentation

## 2016-01-07 DIAGNOSIS — E559 Vitamin D deficiency, unspecified: Secondary | ICD-10-CM | POA: Insufficient documentation

## 2016-01-07 DIAGNOSIS — E785 Hyperlipidemia, unspecified: Secondary | ICD-10-CM | POA: Insufficient documentation

## 2016-01-07 DIAGNOSIS — M791 Myalgia, unspecified site: Secondary | ICD-10-CM | POA: Insufficient documentation

## 2016-01-07 DIAGNOSIS — F330 Major depressive disorder, recurrent, mild: Secondary | ICD-10-CM | POA: Insufficient documentation

## 2016-01-27 ENCOUNTER — Encounter (INDEPENDENT_AMBULATORY_CARE_PROVIDER_SITE_OTHER): Payer: BC Managed Care – PPO | Admitting: Ophthalmology

## 2016-01-27 DIAGNOSIS — H43813 Vitreous degeneration, bilateral: Secondary | ICD-10-CM

## 2016-01-27 DIAGNOSIS — H2513 Age-related nuclear cataract, bilateral: Secondary | ICD-10-CM | POA: Diagnosis not present

## 2016-01-27 DIAGNOSIS — H353132 Nonexudative age-related macular degeneration, bilateral, intermediate dry stage: Secondary | ICD-10-CM | POA: Diagnosis not present

## 2016-04-09 ENCOUNTER — Ambulatory Visit: Payer: BC Managed Care – PPO | Admitting: Family Medicine

## 2016-04-23 ENCOUNTER — Encounter (INDEPENDENT_AMBULATORY_CARE_PROVIDER_SITE_OTHER): Payer: BC Managed Care – PPO | Admitting: Ophthalmology

## 2016-04-23 DIAGNOSIS — D3132 Benign neoplasm of left choroid: Secondary | ICD-10-CM | POA: Diagnosis not present

## 2016-04-23 DIAGNOSIS — H353132 Nonexudative age-related macular degeneration, bilateral, intermediate dry stage: Secondary | ICD-10-CM

## 2016-04-23 DIAGNOSIS — H43813 Vitreous degeneration, bilateral: Secondary | ICD-10-CM | POA: Diagnosis not present

## 2016-05-19 ENCOUNTER — Telehealth: Payer: Self-pay | Admitting: *Deleted

## 2016-05-19 NOTE — Telephone Encounter (Signed)
Patient in 04 recall for 04/2016 for right DX MMG. Please contact patient to schedule Thanks

## 2016-05-29 NOTE — Telephone Encounter (Signed)
Called patient at [phone] to schedule Rt.DX mmg. Per DPR, left message advising patient it was time to schedule and I would be happy to schedule for her or she could call [facility] herself and make appointment.

## 2016-06-18 NOTE — Telephone Encounter (Signed)
Called patient to remind her its time to schedule a Vitamin D recheck and to make appointment for Rt.DX mmg. Per DPR ,left message for patient to call me to schedule appointments.

## 2016-06-23 NOTE — Telephone Encounter (Signed)
Patient has been contacted twice regarding the need to schedule F/U breast imaging. She still has not scheduled. Please advise on letter or recall status Thanks Margaretha Sheffield

## 2016-06-24 ENCOUNTER — Encounter: Payer: Self-pay | Admitting: *Deleted

## 2016-06-24 NOTE — Telephone Encounter (Signed)
Letter sent- Patient removed from recall -eh

## 2016-06-24 NOTE — Telephone Encounter (Signed)
Please send patient our official abnormal mammogram follow up letter.  I will be happy to sign this.

## 2016-07-02 ENCOUNTER — Telehealth: Payer: Self-pay

## 2016-07-02 NOTE — Telephone Encounter (Signed)
Called patient and per DPR left detailed message asking patient to call and make appointment to recheck vitamin d level.

## 2016-07-02 NOTE — Telephone Encounter (Signed)
-----   Message from Nunzio Cobbs, MD sent at 06/07/2016  6:51 PM EST ----- Regarding: Please let patient know she is due for her vit D lab recheck.  Hi Shemia Bevel,   Please contact patient and have her schedule an appointment for her vit D lab visit.  She came up in my reminder box.   Thanks,   Brook ----- Message ----- From: SYSTEM Sent: 06/01/2016  12:05 AM To: Nunzio Cobbs, MD

## 2016-07-15 ENCOUNTER — Other Ambulatory Visit: Payer: Self-pay | Admitting: Obstetrics and Gynecology

## 2016-07-15 DIAGNOSIS — R921 Mammographic calcification found on diagnostic imaging of breast: Secondary | ICD-10-CM

## 2016-07-22 ENCOUNTER — Ambulatory Visit
Admission: RE | Admit: 2016-07-22 | Discharge: 2016-07-22 | Disposition: A | Discharge status: Home / Self Care | Payer: BC Managed Care – PPO | Source: Ambulatory Visit | Attending: Obstetrics and Gynecology | Admitting: Obstetrics and Gynecology

## 2016-07-22 DIAGNOSIS — R921 Mammographic calcification found on diagnostic imaging of breast: Secondary | ICD-10-CM

## 2016-08-14 NOTE — Telephone Encounter (Signed)
Called patient and per DPR, left message to call and make appointment to recheck vitamin D level.

## 2016-08-17 NOTE — Telephone Encounter (Signed)
Encounter closed

## 2016-08-17 NOTE — Telephone Encounter (Signed)
Dr.Silva, I mailed letter to patient. Okay to close encounter?

## 2016-08-24 ENCOUNTER — Ambulatory Visit (INDEPENDENT_AMBULATORY_CARE_PROVIDER_SITE_OTHER): Payer: BC Managed Care – PPO | Admitting: Ophthalmology

## 2016-09-09 ENCOUNTER — Ambulatory Visit (INDEPENDENT_AMBULATORY_CARE_PROVIDER_SITE_OTHER): Payer: BC Managed Care – PPO | Admitting: Ophthalmology

## 2016-09-09 DIAGNOSIS — D3132 Benign neoplasm of left choroid: Secondary | ICD-10-CM | POA: Diagnosis not present

## 2016-09-09 DIAGNOSIS — H353132 Nonexudative age-related macular degeneration, bilateral, intermediate dry stage: Secondary | ICD-10-CM

## 2016-09-09 DIAGNOSIS — H2513 Age-related nuclear cataract, bilateral: Secondary | ICD-10-CM | POA: Diagnosis not present

## 2016-09-09 DIAGNOSIS — H43813 Vitreous degeneration, bilateral: Secondary | ICD-10-CM

## 2016-09-09 DIAGNOSIS — D3131 Benign neoplasm of right choroid: Secondary | ICD-10-CM | POA: Diagnosis not present

## 2016-12-14 ENCOUNTER — Telehealth: Payer: Self-pay | Admitting: *Deleted

## 2016-12-14 NOTE — Telephone Encounter (Signed)
Left message for patient to call regarding 04 recall. Patient needs to schedule f/u breat imagining -eh

## 2016-12-28 NOTE — Telephone Encounter (Signed)
Left another message for patient to call regarding 04 recall -eh

## 2017-01-12 NOTE — Telephone Encounter (Signed)
Left patient 2 messages regarding 04 recall. Please advise on recall status/ letter

## 2017-01-13 NOTE — Telephone Encounter (Signed)
Ok to send our standard letter regarding recommendation for follow up mammogram.

## 2017-01-18 ENCOUNTER — Encounter: Payer: Self-pay | Admitting: *Deleted

## 2017-01-18 NOTE — Telephone Encounter (Signed)
Letter sent - removed from recall -eh

## 2017-02-23 ENCOUNTER — Other Ambulatory Visit: Payer: Self-pay

## 2017-02-23 ENCOUNTER — Other Ambulatory Visit: Payer: Self-pay | Admitting: Obstetrics and Gynecology

## 2017-02-23 DIAGNOSIS — R921 Mammographic calcification found on diagnostic imaging of breast: Secondary | ICD-10-CM

## 2017-02-25 ENCOUNTER — Telehealth: Payer: Self-pay | Admitting: Obstetrics and Gynecology

## 2017-02-25 NOTE — Telephone Encounter (Signed)
Patient states she has a UTI and does not want to come in for an appointment. Patient states she will just go to a walkin clinic for this. Offered appointment patient declined.

## 2017-02-25 NOTE — Telephone Encounter (Signed)
Spoke with patient. Reports dark urine, painful urination at end of stream and low abdominal pain. Taking OTC AZO for symptom relief. Patient asking to come in to leave urine sample for testing. Recommended OV for further evaluation, advised patient would have to review schedule with Dr. Quincy Simmonds for 02/26/17 and return call. Patient states she will go to [facility] clinic to be seen. Advised patient to return call to office if OV still needed or for any additional questions.   Patient states she received MyChart message for reminder for Hepatitis C Screening, was unsure when this will need to be done? Patient states she does not recall Dr. Elza Rafter AEX recommendations after hysterectomy. Last AEX 08/16/15.   Advised patient will review with Dr. Quincy Simmonds and return call with recommendations, patient thankful and verbalizes understanding.   Dr. Quincy Simmonds -please advise on hep C screening? AEX?

## 2017-02-25 NOTE — Telephone Encounter (Addendum)
Patient sent a MyChart message requesting to schedule an appointment for hepatitis C testing. She said she is also still having urinary pain. Routing to triage for further assessment.  Copy of MyChart message below:   << Less Detail',event)" href="javascript:;">More Detail >>  Appointment Request (HM)  [place] [address] [place]   [name]  MRN: [id number] DOB: 20-Dec-1955  Pt Work: [phone] Pt Home: [phone]  Entered: [phone]      Sent: Thu February 25, 2017 2:44 PM  To: Jamse Arn Pool  Message   Appointment Request From: Denise Munoz    With Provider: Arloa Koh, MD [facility]]    Preferred Date Range: From 02/25/2017 To 03/12/2017    Preferred Times: Any    Reason: To address the following health maintenance concerns.  Hepatitis C Screening    Comments:  Any time after 8:30 a.m. Not Aug 23-27.

## 2017-02-26 ENCOUNTER — Ambulatory Visit (INDEPENDENT_AMBULATORY_CARE_PROVIDER_SITE_OTHER): Payer: BC Managed Care – PPO | Admitting: Family Medicine

## 2017-02-26 ENCOUNTER — Other Ambulatory Visit: Payer: BC Managed Care – PPO

## 2017-02-26 ENCOUNTER — Encounter: Payer: Self-pay | Admitting: Family Medicine

## 2017-02-26 VITALS — BP 100/80 | HR 91 | Temp 98.6°F | Ht 62.0 in

## 2017-02-26 DIAGNOSIS — R300 Dysuria: Secondary | ICD-10-CM | POA: Diagnosis not present

## 2017-02-26 LAB — POCT URINALYSIS DIPSTICK
Bilirubin, UA: NEGATIVE
Blood, UA: NEGATIVE
Glucose, UA: NEGATIVE
Ketones, UA: NEGATIVE
Nitrite, UA: NEGATIVE
PROTEIN UA: NEGATIVE
Spec Grav, UA: 1.015 (ref 1.010–1.025)
UROBILINOGEN UA: 0.2 U/dL
pH, UA: 6 (ref 5.0–8.0)

## 2017-02-26 MED ORDER — NITROFURANTOIN MONOHYD MACRO 100 MG PO CAPS: 100.0000 mg | ORAL_CAPSULE | Freq: Two times a day (BID) | ORAL | 0 refills | Status: DC

## 2017-02-26 NOTE — Telephone Encounter (Signed)
Called patient and lmovm to call me back.

## 2017-02-26 NOTE — Telephone Encounter (Signed)
Spoke with patient and reviewed with her Dr.Silva's message below. She states she will keep her appointment for her mammogram this month. Recommended she should schedule AEX and she felt she was on a 2 year rotation but advised we still felt patients should be seen yearly. She didn't state whether she would schedule AEX before 08/2017.

## 2017-02-26 NOTE — Progress Notes (Signed)
Patient declines weight measurement today.

## 2017-02-26 NOTE — Patient Instructions (Addendum)
BEFORE YOU LEAVE: -follow up: CPE in next 1-3 months, come fasting so that we can check labs  Take the antibiotic as instructed.  I hope you are feeling better soon! Follow up sooner if worsening, new concerns or you are not improving with treatment.

## 2017-02-26 NOTE — Telephone Encounter (Signed)
I do recommend hep C testing for the patient if this has not already been done.  She falls into the age group for which testing is recommended.   She does not need pap smears now that she has had hysterectomy.   Her biggest outstanding issue is the overdue mammogram follow up.  I see that she now has an appointment for this month, which is great.

## 2017-02-26 NOTE — Progress Notes (Signed)
HPI:  Acute visit for Dysuria: -started 4-5 days ago -symptoms include burning with urination, frequency and urgency -Azo helps -denies: fevers, malaise, flank pain, vomiting, vaginal discharge, concern for STI, hematuria -hx hysterectomy  ROS: See pertinent positives and negatives per HPI.  Past Medical History:  Diagnosis Date  . Abnormal Pap smear of cervix ~1986   probable cryotherapy to cervix per patient  . Arthritis    saw Dr. Charlestine Night for polyarthralgia and chronic fatigue, ? fibromyalgia  . Diverticulitis 09/25/15   Noted on CT scan  . Fibromyalgia   . Headache    history of migraines  . History of mumps as a child   . Hyperlipidemia 08/16/15  . Leukocytosis    seen by rheumatology and oncology in the past and per pt felt weight related  . Low serum vitamin D 08/16/15  . Scarlet fever     Past Surgical History:  Procedure Laterality Date  . ABDOMINAL HYSTERECTOMY N/A 07/17/2014   Procedure: for fibroids, HYSTERECTOMY ABDOMINAL;  Surgeon: Jamey Reas de Berton Lan, MD;  Location: [facility];  Service: Gynecology;  Laterality: N/A;  . COLPOSCOPY  ~1986   Normal  . SALPINGOOPHORECTOMY Bilateral 07/17/2014   Procedure: SALPINGO OOPHORECTOMY with collection of pelvic washings;  Surgeon: Jamey Reas de Berton Lan, MD;  Location: [facility];  Service: Gynecology;  Laterality: Bilateral;  . TONSILLECTOMY      Family History  Problem Relation Age of Onset  . Breast cancer Mother 108  . Alzheimer's disease Father 29  . Breast cancer Maternal Grandmother 33  . Colon cancer Neg Hx     Social History   Social History  . Marital status: Divorced    Spouse name: N/A  . Number of children: N/A  . Years of education: N/A   Social History Main Topics  . Smoking status: Former Smoker    Packs/day: 0.50    Years: 36.00    Quit date: 05/17/2010  . Smokeless tobacco: Never Used  . Alcohol use No  . Drug use: No  . Sexual activity: Not Currently    Birth  control/ protection: Post-menopausal, Surgical     Comment: TAH/BSO 09-05-14   Other Topics Concern  . None   Social History Narrative   Work or School: Stage manager Situation:      Spiritual Beliefs:      Lifestyle: admits likes sugar, soda, lots of dairy; no regular exercise           Current Outpatient Prescriptions:  .  acetaminophen (TYLENOL) 500 MG tablet, Take 500 mg by mouth every 6 (six) hours as needed. Patient takes 500-1,000 mg prn, Disp: , Rfl:  .  ibuprofen (ADVIL,MOTRIN) 200 MG tablet, Take 200 mg by mouth every 6 (six) hours as needed. Patient takes 200-400 mg prn, Disp: , Rfl:  .  KRILL OIL PO, Take by mouth., Disp: , Rfl:  .  OVER THE COUNTER MEDICATION, AREDS Preservision, Disp: , Rfl:  .  valACYclovir (VALTREX) 1000 MG tablet, Take 1,000 mg by mouth as needed (breakouts after taking antibiotics)., Disp: , Rfl:  .  nitrofurantoin, macrocrystal-monohydrate, (MACROBID) 100 MG capsule, Take 1 capsule (100 mg total) by mouth 2 (two) times daily., Disp: 14 capsule, Rfl: 0  EXAM:  Vitals:   02/26/17 1303  BP: 100/80  Pulse: 91  Temp: 98.6 F (37 C)    There is no height or weight on file to calculate BMI.  GENERAL: vitals reviewed and listed above, alert, oriented, appears well hydrated and in no acute distress  HEENT: atraumatic, conjunttiva clear, no obvious abnormalities on inspection of external nose and ears  NECK: no obvious masses on inspection  LUNGS: clear to auscultation bilaterally, no wheezes, rales or rhonchi, good air movement  CV: HRRR, no peripheral edema  ABD: BS+, soft, NTP, no CVA TTP  MS: moves all extremities without noticeable abnormality  PSYCH: pleasant and cooperative, no obvious depression or anxiety  ASSESSMENT AND PLAN:  Discussed the following assessment and plan:  Dysuria - Plan: POC Urinalysis Dipstick  -she could only give [place] a very small urine sample, dip with lg leuks -discussed potential  etiologies, options for further eval, she prefers to try empiric abx for likely uncomplicated UTI, macrobid after discussion risks -Patient advised to return or notify a doctor immediately if symptoms worsen or persist or new concerns arise. -CPE in 1-3 months  Patient Instructions  BEFORE YOU LEAVE: -follow up: CPE in next 1-3 months, come fasting so that we can check labs  Take the antibiotic as instructed.  I hope you are feeling better soon! Follow up sooner if worsening, new concerns or you are not improving with treatment.      Colin Benton R., DO

## 2017-03-12 ENCOUNTER — Other Ambulatory Visit: Payer: Self-pay | Admitting: Obstetrics and Gynecology

## 2017-03-12 ENCOUNTER — Ambulatory Visit
Admission: RE | Admit: 2017-03-12 | Discharge: 2017-03-12 | Disposition: A | Discharge status: Home / Self Care | Payer: BC Managed Care – PPO | Source: Ambulatory Visit | Attending: Obstetrics and Gynecology | Admitting: Obstetrics and Gynecology

## 2017-03-12 DIAGNOSIS — R921 Mammographic calcification found on diagnostic imaging of breast: Secondary | ICD-10-CM

## 2017-03-13 HISTORY — PX: BREAST BIOPSY: SHX20

## 2017-03-17 ENCOUNTER — Ambulatory Visit
Admission: RE | Admit: 2017-03-17 | Discharge: 2017-03-17 | Disposition: A | Discharge status: Home / Self Care | Payer: BC Managed Care – PPO | Source: Ambulatory Visit | Attending: Obstetrics and Gynecology | Admitting: Obstetrics and Gynecology

## 2017-03-17 ENCOUNTER — Other Ambulatory Visit: Payer: Self-pay | Admitting: Obstetrics and Gynecology

## 2017-03-17 DIAGNOSIS — R921 Mammographic calcification found on diagnostic imaging of breast: Secondary | ICD-10-CM

## 2017-04-01 ENCOUNTER — Encounter: Payer: Self-pay | Admitting: Family Medicine

## 2017-04-21 NOTE — Progress Notes (Signed)
HPI:  Here for CPE: Sees Dr. Quincy Simmonds for gyn exams. Reports did skin exam with dermatologist Due for labs, flu shot, hep c screening -Concerns and/or follow up today:   Chronic muscle soreness/arthralgias/chronic fatigue: -reported evals with rheumatology and oncology and told was fibromylagia, OA -not happy with diagnoses -continues to feel poorly but improving  Hx depression and anxiety: -feels is a result of feeling unwell -does not take medications -no counseling -anhedonia  Chronic abd pain/bloating: -started with hysterectomy -has had extensive eval with gyn and GI per report and CT scan  Obesity/Atherosclerosis aorta/HLD/Fatty Liver -walking or going to the gym daily and is feeling better -diet is poor - lots of sugar, pepsi is a weakness -meds:krill oil  Vit D deficiency: -Dr. Quincy Simmonds treating - pt stopped taking vit D  Chronic leukocytosis: -eval with oncology and felt given chronicity was likely reactive from obesity and OA per review prior notes   -Taking folic acid, vitamin D or calcium: no  -Diabetes and Dyslipidemia Screening:fasting for labs  -Hx of HTN: no  -Vaccines: refused today  -pap history: w/ Dr. Quincy Simmonds 08/2015 neg with hpv neg  -FDLMP:  -sexual activity: yes, female partner, no new partners  -wants STI testing (Hep C if born [birth year]): no  -FH breast, colon or ovarian ca: see FH Last mammogram: sees Dr. Quincy Simmonds Last colon cancer screening: UTD  Breast Ca Risk Assessment: -SolutionApps.it  Genetic Counseling Screen: Http://www.breastcancergenesscreen.org/startScreen.aspx  FRAX (50-65):  DEXA (>/= 65):   -Alcohol, Tobacco, drug use: see social history  Review of Systems - no fevers, unintentional weight loss, vision loss, hearing loss, chest pain, sob, hemoptysis, melena, hematochezia, hematuria, genital discharge, changing or concerning skin lesions, bleeding, bruising, loc, thoughts of self harm or SI  Past Medical  History:  Diagnosis Date  . Abnormal Pap smear of cervix ~1986   probable cryotherapy to cervix per patient  . Arthritis    saw Dr. Charlestine Night for polyarthralgia and chronic fatigue, ? fibromyalgia  . Diverticulitis 09/25/15   Noted on CT scan  . Fibromyalgia   . Headache    history of migraines  . History of mumps as a child   . Hyperlipidemia 08/16/15  . Leukocytosis    seen by rheumatology and oncology in the past and per pt felt weight related  . Low serum vitamin D 08/16/15  . Scarlet fever     Past Surgical History:  Procedure Laterality Date  . ABDOMINAL HYSTERECTOMY N/A 07/17/2014   Procedure: for fibroids, HYSTERECTOMY ABDOMINAL;  Surgeon: Jamey Reas de Berton Lan, MD;  Location: [facility];  Service: Gynecology;  Laterality: N/A;  . COLPOSCOPY  ~1986   Normal  . SALPINGOOPHORECTOMY Bilateral 07/17/2014   Procedure: SALPINGO OOPHORECTOMY with collection of pelvic washings;  Surgeon: Jamey Reas de Berton Lan, MD;  Location: [facility];  Service: Gynecology;  Laterality: Bilateral;  . TONSILLECTOMY      Family History  Problem Relation Age of Onset  . Breast cancer Mother 72  . Alzheimer's disease Father 1  . Breast cancer Maternal Grandmother 10  . Colon cancer Neg Hx     Social History   Social History  . Marital status: Divorced    Spouse name: N/A  . Number of children: N/A  . Years of education: N/A   Social History Main Topics  . Smoking status: Former Smoker    Packs/day: 0.50    Years: 36.00    Quit date: 05/17/2010  . Smokeless tobacco: Never  Used  . Alcohol use No  . Drug use: No  . Sexual activity: Not Currently    Birth control/ protection: Post-menopausal, Surgical     Comment: TAH/BSO 09-05-14   Other Topics Concern  . None   Social History Narrative   Work or School: Stage manager Situation:      Spiritual Beliefs:      Lifestyle: admits likes sugar, soda, lots of dairy; no regular exercise            Current Outpatient Prescriptions:  .  acetaminophen (TYLENOL) 500 MG tablet, Take 500 mg by mouth every 6 (six) hours as needed. Patient takes 500-1,000 mg prn, Disp: , Rfl:  .  ibuprofen (ADVIL,MOTRIN) 200 MG tablet, Take 200 mg by mouth every 6 (six) hours as needed. Patient takes 200-400 mg prn, Disp: , Rfl:  .  KRILL OIL PO, Take by mouth., Disp: , Rfl:  .  OVER THE COUNTER MEDICATION, AREDS Preservision, Disp: , Rfl:  .  valACYclovir (VALTREX) 1000 MG tablet, Take 1,000 mg by mouth as needed (breakouts after taking antibiotics)., Disp: , Rfl:  .  DULoxetine (CYMBALTA) 30 MG capsule, Take 1 capsule (30 mg total) by mouth daily., Disp: 30 capsule, Rfl: 3  EXAM:  Vitals:   04/22/17 0937  BP: 100/80  Pulse: 68  Temp: 98.3 F (36.8 C)   Body mass index is 37.79 kg/m.  GENERAL: vitals reviewed and listed below, alert, oriented, appears well hydrated and in no acute distress  HEENT: head atraumatic, PERRLA, normal appearance of eyes, ears, nose and mouth. moist mucus membranes.  NECK: supple, no masses or lymphadenopathy  LUNGS: clear to auscultation bilaterally, no rales, rhonchi or wheeze  CV: HRRR, no peripheral edema or cyanosis, normal pedal pulses  ABDOMEN: bowel sounds normal, soft, non tender to palpation, no masses, no rebound or guarding  SKIN: no rash or abnormal lesions  MS: normal gait, moves all extremities normally  NEURO: normal gait, speech and thought processing grossly intact, muscle tone grossly intact throughout  PSYCH: normal affect, pleasant and cooperative  ASSESSMENT AND PLAN:  Discussed the following assessment and plan:  1. Encounter for preventive health examination -Discussed and advised all [place] preventive services health task force level A and B recommendations for age, sex and risks. -Advised at least 150 minutes of exercise per week and a healthy diet. -labs, studies and vaccines per orders this encounter  2. Chronic pain of  multiple joints, Chronic pain -s/p prior eval with specialist and dx with OA and fibro -opted to try cymbalta - see below -she may also consider OMT -exercise helping  3. Chronic fatigue Depression -discussed options -she wants to try cymbalta after discussion risks/benefits -CBT advised  4. BMI 37.0-37.9, adult -lifestyle recs discussed at length and advised healthy low sugar diet - Hemoglobin A1c  5. Hyperlipidemia, unspecified hyperlipidemia type -lifestyle recs - Lipid panel  7. Vitamin D insufficiency -advised [phone] IU Vit D3 daily  9. NASH (nonalcoholic steatohepatitis) - Comprehensive metabolic panel -lifestyle recs  10. Leukocytosis, unspecified type - CBC with Differential/Platelet  11. Encounter for hepatitis C virus screening test for high risk patient - Hepatitis C antibody    Orders Placed This Encounter  Procedures  . Hepatitis C antibody  . Hemoglobin A1c  . Lipid panel  . CBC with Differential/Platelet  . Comprehensive metabolic panel    Patient advised to return to clinic immediately if symptoms worsen or persist or new concerns.  Patient Instructions  BEFORE YOU LEAVE: -behavioral health brochure -follow up: 4-6 weeks  Start cymbalta 30 mg daily for chronic pain, motivation  Source Naturals Vit D3 drops 1000 IU daily  Consider b12 1000 mcg daily  Consider schedulling appointment for Cognitive Behavioral therapy  We have ordered labs or studies at this visit. It can take up to 1-2 weeks for results and processing. IF results require follow up or explanation, we will call you with instructions. Clinically stable results will be released to your [facility]. If you have not heard from [place] or cannot find your results in [facility] in 2 weeks please contact our office at [phone].  If you are not yet signed up for [facility], please consider signing up.   We recommend the following healthy lifestyle for LIFE: 1) Small portions - but regular  healthy meals.  2) Eat a healthy clean diet.   TRY TO EAT: -at least 5-7 servings of low sugar vegetables per day (not corn, potatoes or bananas.) -berries are the best choice if you wish to eat fruit.   -lean meets (fish, chicken or [place] breasts) -vegan proteins for some meals - beans or tofu, whole grains, nuts and seeds -Replace bad fats with good fats - good fats include: fish, nuts and seeds, canola oil, olive oil -small amounts of low fat or non fat dairy -small amounts of100 % whole grains - check the lables  AVOID: -SUGAR, sweets, anything with added sugar, corn syrup or sweeteners -if you must have a sweetener, small amounts of stevia may be best -sweetened beverages -simple starches (rice, bread, potatoes, pasta, chips, etc - small amounts of 100% whole grains are ok) -red meat, pork, butter -fried foods, fast food, processed food, excessive dairy, eggs and coconut.  3)Get at least 150 minutes of sweaty aerobic exercise per week.  4)Reduce stress - consider counseling, meditation and relaxation to balance other aspects of your life.    Health Maintenance for Postmenopausal Women Menopause is a normal process in which your reproductive ability comes to an end. This process happens gradually over a span of months to years, usually between the ages of 89 and 61. Menopause is complete when you have missed 12 consecutive menstrual periods. It is important to talk with your health care provider about some of the most common conditions that affect postmenopausal women, such as heart disease, cancer, and bone loss (osteoporosis). Adopting a healthy lifestyle and getting preventive care can help to promote your health and wellness. Those actions can also lower your chances of developing some of these common conditions. What should I know about menopause? During menopause, you may experience a number of symptoms, such as:  Moderate-to-severe hot flashes.  Night  sweats.  Decrease in sex drive.  Mood swings.  Headaches.  Tiredness.  Irritability.  Memory problems.  Insomnia.  Choosing to treat or not to treat menopausal changes is an individual decision that you make with your health care provider. What should I know about hormone replacement therapy and supplements? Hormone therapy products are effective for treating symptoms that are associated with menopause, such as hot flashes and night sweats. Hormone replacement carries certain risks, especially as you become older. If you are thinking about using estrogen or estrogen with progestin treatments, discuss the benefits and risks with your health care provider. What should I know about heart disease and stroke? Heart disease, heart attack, and stroke become more likely as you age. This may be due, in part, to the hormonal  changes that your body experiences during menopause. These can affect how your body processes dietary fats, triglycerides, and cholesterol. Heart attack and stroke are both medical emergencies. There are many things that you can do to help prevent heart disease and stroke:  Have your blood pressure checked at least every 1-2 years. High blood pressure causes heart disease and increases the risk of stroke.  If you are 63-75 years old, ask your health care provider if you should take aspirin to prevent a heart attack or a stroke.  Do not use any tobacco products, including cigarettes, chewing tobacco, or electronic cigarettes. If you need help quitting, ask your health care provider.  It is important to eat a healthy diet and maintain a healthy weight. ? Be sure to include plenty of vegetables, fruits, low-fat dairy products, and lean protein. ? Avoid eating foods that are high in solid fats, added sugars, or salt (sodium).  Get regular exercise. This is one of the most important things that you can do for your health. ? Try to exercise for at least 150 minutes each week.  The type of exercise that you do should increase your heart rate and make you sweat. This is known as moderate-intensity exercise. ? Try to do strengthening exercises at least twice each week. Do these in addition to the moderate-intensity exercise.  Know your numbers.Ask your health care provider to check your cholesterol and your blood glucose. Continue to have your blood tested as directed by your health care provider.  What should I know about cancer screening? There are several types of cancer. Take the following steps to reduce your risk and to catch any cancer development as early as possible. Breast Cancer  Practice breast self-awareness. ? This means understanding how your breasts normally appear and feel. ? It also means doing regular breast self-exams. Let your health care provider know about any changes, no matter how small.  If you are 62 or older, have a clinician do a breast exam (clinical breast exam or CBE) every year. Depending on your age, family history, and medical history, it may be recommended that you also have a yearly breast X-ray (mammogram).  If you have a family history of breast cancer, talk with your health care provider about genetic screening.  If you are at high risk for breast cancer, talk with your health care provider about having an MRI and a mammogram every year.  Breast cancer (BRCA) gene test is recommended for women who have family members with BRCA-related cancers. Results of the assessment will determine the need for genetic counseling and BRCA1 and for BRCA2 testing. BRCA-related cancers include these types: ? Breast. This occurs in males or females. ? Ovarian. ? Tubal. This may also be called fallopian tube cancer. ? Cancer of the abdominal or pelvic lining (peritoneal cancer). ? Prostate. ? Pancreatic.  Cervical, Uterine, and Ovarian Cancer Your health care provider may recommend that you be screened regularly for cancer of the pelvic  organs. These include your ovaries, uterus, and vagina. This screening involves a pelvic exam, which includes checking for microscopic changes to the surface of your cervix (Pap test).  For women ages 21-65, health care providers may recommend a pelvic exam and a Pap test every three years. For women ages 25-65, they may recommend the Pap test and pelvic exam, combined with testing for human papilloma virus (HPV), every five years. Some types of HPV increase your risk of cervical cancer. Testing for HPV may also be  done on women of any age who have unclear Pap test results.  Other health care providers may not recommend any screening for nonpregnant women who are considered low risk for pelvic cancer and have no symptoms. Ask your health care provider if a screening pelvic exam is right for you.  If you have had past treatment for cervical cancer or a condition that could lead to cancer, you need Pap tests and screening for cancer for at least 20 years after your treatment. If Pap tests have been discontinued for you, your risk factors (such as having a new sexual partner) need to be reassessed to determine if you should start having screenings again. Some women have medical problems that increase the chance of getting cervical cancer. In these cases, your health care provider may recommend that you have screening and Pap tests more often.  If you have a family history of uterine cancer or ovarian cancer, talk with your health care provider about genetic screening.  If you have vaginal bleeding after reaching menopause, tell your health care provider.  There are currently no reliable tests available to screen for ovarian cancer.  Lung Cancer Lung cancer screening is recommended for adults 93-60 years old who are at high risk for lung cancer because of a history of smoking. A yearly low-dose CT scan of the lungs is recommended if you:  Currently smoke.  Have a history of at least 30 pack-years of  smoking and you currently smoke or have quit within the past 15 years. A pack-year is smoking an average of one pack of cigarettes per day for one year.  Yearly screening should:  Continue until it has been 15 years since you quit.  Stop if you develop a health problem that would prevent you from having lung cancer treatment.  Colorectal Cancer  This type of cancer can be detected and can often be prevented.  Routine colorectal cancer screening usually begins at age 91 and continues through age 70.  If you have risk factors for colon cancer, your health care provider may recommend that you be screened at an earlier age.  If you have a family history of colorectal cancer, talk with your health care provider about genetic screening.  Your health care provider may also recommend using home test kits to check for hidden blood in your stool.  A small camera at the end of a tube can be used to examine your colon directly (sigmoidoscopy or colonoscopy). This is done to check for the earliest forms of colorectal cancer.  Direct examination of the colon should be repeated every 5-10 years until age 61. However, if early forms of precancerous polyps or small growths are found or if you have a family history or genetic risk for colorectal cancer, you may need to be screened more often.  Skin Cancer  Check your skin from head to toe regularly.  Monitor any moles. Be sure to tell your health care provider: ? About any new moles or changes in moles, especially if there is a change in a mole's shape or color. ? If you have a mole that is larger than the size of a pencil eraser.  If any of your family members has a history of skin cancer, especially at a young age, talk with your health care provider about genetic screening.  Always use sunscreen. Apply sunscreen liberally and repeatedly throughout the day.  Whenever you are outside, protect yourself by wearing long sleeves, pants, a wide-brimmed  hat,  and sunglasses.  What should I know about osteoporosis? Osteoporosis is a condition in which bone destruction happens more quickly than new bone creation. After menopause, you may be at an increased risk for osteoporosis. To help prevent osteoporosis or the bone fractures that can happen because of osteoporosis, the following is recommended:  If you are 78-68 years old, get at least 1,000 mg of calcium and at least 600 mg of vitamin D per day.  If you are older than age 56 but younger than age 64, get at least 1,200 mg of calcium and at least 600 mg of vitamin D per day.  If you are older than age 15, get at least 1,200 mg of calcium and at least 800 mg of vitamin D per day.  Smoking and excessive alcohol intake increase the risk of osteoporosis. Eat foods that are rich in calcium and vitamin D, and do weight-bearing exercises several times each week as directed by your health care provider. What should I know about how menopause affects my mental health? Depression may occur at any age, but it is more common as you become older. Common symptoms of depression include:  Low or sad mood.  Changes in sleep patterns.  Changes in appetite or eating patterns.  Feeling an overall lack of motivation or enjoyment of activities that you previously enjoyed.  Frequent crying spells.  Talk with your health care provider if you think that you are experiencing depression. What should I know about immunizations? It is important that you get and maintain your immunizations. These include:  Tetanus, diphtheria, and pertussis (Tdap) booster vaccine.  Influenza every year before the flu season begins.  Pneumonia vaccine.  Shingles vaccine.  Your health care provider may also recommend other immunizations. This information is not intended to replace advice given to you by your health care provider. Make sure you discuss any questions you have with your health care provider. Document Released:  08/21/2005 Document Revised: 01/17/2016 Document Reviewed: 04/02/2015 Elsevier Interactive Patient Education  2018 [employer].        No Follow-up on file.  Colin Benton R., DO

## 2017-04-22 ENCOUNTER — Encounter: Payer: Self-pay | Admitting: Family Medicine

## 2017-04-22 ENCOUNTER — Ambulatory Visit (INDEPENDENT_AMBULATORY_CARE_PROVIDER_SITE_OTHER): Payer: BC Managed Care – PPO | Admitting: Family Medicine

## 2017-04-22 VITALS — BP 100/80 | HR 68 | Temp 98.3°F | Ht 62.0 in | Wt 206.6 lb

## 2017-04-22 DIAGNOSIS — F330 Major depressive disorder, recurrent, mild: Secondary | ICD-10-CM

## 2017-04-22 DIAGNOSIS — R5382 Chronic fatigue, unspecified: Secondary | ICD-10-CM

## 2017-04-22 DIAGNOSIS — G8929 Other chronic pain: Secondary | ICD-10-CM

## 2017-04-22 DIAGNOSIS — Z6837 Body mass index (BMI) 37.0-37.9, adult: Secondary | ICD-10-CM | POA: Diagnosis not present

## 2017-04-22 DIAGNOSIS — Z0000 Encounter for general adult medical examination without abnormal findings: Secondary | ICD-10-CM

## 2017-04-22 DIAGNOSIS — K7581 Nonalcoholic steatohepatitis (NASH): Secondary | ICD-10-CM | POA: Diagnosis not present

## 2017-04-22 DIAGNOSIS — Z9189 Other specified personal risk factors, not elsewhere classified: Secondary | ICD-10-CM | POA: Diagnosis not present

## 2017-04-22 DIAGNOSIS — E559 Vitamin D deficiency, unspecified: Secondary | ICD-10-CM | POA: Diagnosis not present

## 2017-04-22 DIAGNOSIS — D72829 Elevated white blood cell count, unspecified: Secondary | ICD-10-CM | POA: Diagnosis not present

## 2017-04-22 DIAGNOSIS — E785 Hyperlipidemia, unspecified: Secondary | ICD-10-CM | POA: Diagnosis not present

## 2017-04-22 DIAGNOSIS — M791 Myalgia, unspecified site: Secondary | ICD-10-CM | POA: Diagnosis not present

## 2017-04-22 DIAGNOSIS — M2550 Pain in unspecified joint: Secondary | ICD-10-CM | POA: Diagnosis not present

## 2017-04-22 DIAGNOSIS — Z1159 Encounter for screening for other viral diseases: Secondary | ICD-10-CM

## 2017-04-22 LAB — COMPREHENSIVE METABOLIC PANEL
ALBUMIN: 4 g/dL (ref 3.5–5.2)
ALT: 18 U/L (ref 0–35)
AST: 19 U/L (ref 0–37)
Alkaline Phosphatase: 115 U/L (ref 39–117)
BILIRUBIN TOTAL: 0.5 mg/dL (ref 0.2–1.2)
BUN: 11 mg/dL (ref 6–23)
CHLORIDE: 104 meq/L (ref 96–112)
CO2: 30 meq/L (ref 19–32)
Calcium: 8.3 mg/dL — ABNORMAL LOW (ref 8.4–10.5)
Creatinine, Ser: 0.63 mg/dL (ref 0.40–1.20)
GFR: 101.44 mL/min (ref >60.00)
Glucose, Bld: 99 mg/dL (ref 70–99)
Potassium: 4 mEq/L (ref 3.5–5.1)
Sodium: 139 mEq/L (ref 135–145)
Total Protein: 7.1 g/dL (ref 6.0–8.3)

## 2017-04-22 LAB — CBC WITH DIFFERENTIAL/PLATELET
BASOS PCT: 0.5 % (ref 0.0–3.0)
Basophils Absolute: 0.1 10*3/uL (ref 0.0–0.1)
Eosinophils Absolute: 0.3 10*3/uL (ref 0.0–0.7)
Eosinophils Relative: 2.7 % (ref 0.0–5.0)
HEMATOCRIT: 42.3 % (ref 36.0–46.0)
Hemoglobin: 14.4 g/dL (ref 12.0–15.0)
LYMPHS PCT: 26.5 % (ref 12.0–46.0)
Lymphs Abs: 2.6 10*3/uL (ref 0.7–4.0)
MCHC: 34.1 g/dL (ref 30.0–36.0)
MCV: 86.9 fl (ref 78.0–100.0)
MONOS PCT: 8.7 % (ref 3.0–12.0)
Monocytes Absolute: 0.8 10*3/uL (ref 0.1–1.0)
NEUTROS ABS: 6 10*3/uL (ref 1.4–7.7)
Neutrophils Relative %: 61.6 % (ref 43.0–77.0)
PLATELETS: 259 10*3/uL (ref 150.0–400.0)
RBC: 4.86 Mil/uL (ref 3.87–5.11)
RDW: 13.5 % (ref 11.5–15.5)
WBC: 9.7 10*3/uL (ref 4.0–10.5)

## 2017-04-22 LAB — LIPID PANEL
CHOLESTEROL: 195 mg/dL (ref 0–200)
HDL: 41.6 mg/dL (ref >39.00)
LDL Cholesterol: 121 mg/dL — ABNORMAL HIGH (ref 0–99)
NonHDL: 153.66
TRIGLYCERIDES: 163 mg/dL — AB (ref 0.0–149.0)
Total CHOL/HDL Ratio: 5
VLDL: 32.6 mg/dL (ref 0.0–40.0)

## 2017-04-22 LAB — HEMOGLOBIN A1C: HEMOGLOBIN A1C: 5.3 % (ref 4.6–6.5)

## 2017-04-22 MED ORDER — DULOXETINE HCL 30 MG PO CPEP: 30.0000 mg | ORAL_CAPSULE | Freq: Every day | ORAL | 3 refills | Status: DC

## 2017-04-22 NOTE — Patient Instructions (Signed)
BEFORE YOU LEAVE: -behavioral health brochure -follow up: 4-6 weeks  Start cymbalta 30 mg daily for chronic pain, motivation  Source Naturals Vit D3 drops 1000 IU daily  Consider b12 1000 mcg daily  Consider schedulling appointment for Cognitive Behavioral therapy  We have ordered labs or studies at this visit. It can take up to 1-2 weeks for results and processing. IF results require follow up or explanation, we will call you with instructions. Clinically stable results will be released to your [facility]. If you have not heard from [place] or cannot find your results in [facility] in 2 weeks please contact our office at [phone].  If you are not yet signed up for [facility], please consider signing up.   We recommend the following healthy lifestyle for LIFE: 1) Small portions - but regular healthy meals.  2) Eat a healthy clean diet.   TRY TO EAT: -at least 5-7 servings of low sugar vegetables per day (not corn, potatoes or bananas.) -berries are the best choice if you wish to eat fruit.   -lean meets (fish, chicken or [place] breasts) -vegan proteins for some meals - beans or tofu, whole grains, nuts and seeds -Replace bad fats with good fats - good fats include: fish, nuts and seeds, canola oil, olive oil -small amounts of low fat or non fat dairy -small amounts of100 % whole grains - check the lables  AVOID: -SUGAR, sweets, anything with added sugar, corn syrup or sweeteners -if you must have a sweetener, small amounts of stevia may be best -sweetened beverages -simple starches (rice, bread, potatoes, pasta, chips, etc - small amounts of 100% whole grains are ok) -red meat, pork, butter -fried foods, fast food, processed food, excessive dairy, eggs and coconut.  3)Get at least 150 minutes of sweaty aerobic exercise per week.  4)Reduce stress - consider counseling, meditation and relaxation to balance other aspects of your life.    Health Maintenance for Postmenopausal  Women Menopause is a normal process in which your reproductive ability comes to an end. This process happens gradually over a span of months to years, usually between the ages of 22 and 55. Menopause is complete when you have missed 12 consecutive menstrual periods. It is important to talk with your health care provider about some of the most common conditions that affect postmenopausal women, such as heart disease, cancer, and bone loss (osteoporosis). Adopting a healthy lifestyle and getting preventive care can help to promote your health and wellness. Those actions can also lower your chances of developing some of these common conditions. What should I know about menopause? During menopause, you may experience a number of symptoms, such as:  Moderate-to-severe hot flashes.  Night sweats.  Decrease in sex drive.  Mood swings.  Headaches.  Tiredness.  Irritability.  Memory problems.  Insomnia.  Choosing to treat or not to treat menopausal changes is an individual decision that you make with your health care provider. What should I know about hormone replacement therapy and supplements? Hormone therapy products are effective for treating symptoms that are associated with menopause, such as hot flashes and night sweats. Hormone replacement carries certain risks, especially as you become older. If you are thinking about using estrogen or estrogen with progestin treatments, discuss the benefits and risks with your health care provider. What should I know about heart disease and stroke? Heart disease, heart attack, and stroke become more likely as you age. This may be due, in part, to the hormonal changes that your body  experiences during menopause. These can affect how your body processes dietary fats, triglycerides, and cholesterol. Heart attack and stroke are both medical emergencies. There are many things that you can do to help prevent heart disease and stroke:  Have your blood  pressure checked at least every 1-2 years. High blood pressure causes heart disease and increases the risk of stroke.  If you are 49-20 years old, ask your health care provider if you should take aspirin to prevent a heart attack or a stroke.  Do not use any tobacco products, including cigarettes, chewing tobacco, or electronic cigarettes. If you need help quitting, ask your health care provider.  It is important to eat a healthy diet and maintain a healthy weight. ? Be sure to include plenty of vegetables, fruits, low-fat dairy products, and lean protein. ? Avoid eating foods that are high in solid fats, added sugars, or salt (sodium).  Get regular exercise. This is one of the most important things that you can do for your health. ? Try to exercise for at least 150 minutes each week. The type of exercise that you do should increase your heart rate and make you sweat. This is known as moderate-intensity exercise. ? Try to do strengthening exercises at least twice each week. Do these in addition to the moderate-intensity exercise.  Know your numbers.Ask your health care provider to check your cholesterol and your blood glucose. Continue to have your blood tested as directed by your health care provider.  What should I know about cancer screening? There are several types of cancer. Take the following steps to reduce your risk and to catch any cancer development as early as possible. Breast Cancer  Practice breast self-awareness. ? This means understanding how your breasts normally appear and feel. ? It also means doing regular breast self-exams. Let your health care provider know about any changes, no matter how small.  If you are 27 or older, have a clinician do a breast exam (clinical breast exam or CBE) every year. Depending on your age, family history, and medical history, it may be recommended that you also have a yearly breast X-ray (mammogram).  If you have a family history of breast  cancer, talk with your health care provider about genetic screening.  If you are at high risk for breast cancer, talk with your health care provider about having an MRI and a mammogram every year.  Breast cancer (BRCA) gene test is recommended for women who have family members with BRCA-related cancers. Results of the assessment will determine the need for genetic counseling and BRCA1 and for BRCA2 testing. BRCA-related cancers include these types: ? Breast. This occurs in males or females. ? Ovarian. ? Tubal. This may also be called fallopian tube cancer. ? Cancer of the abdominal or pelvic lining (peritoneal cancer). ? Prostate. ? Pancreatic.  Cervical, Uterine, and Ovarian Cancer Your health care provider may recommend that you be screened regularly for cancer of the pelvic organs. These include your ovaries, uterus, and vagina. This screening involves a pelvic exam, which includes checking for microscopic changes to the surface of your cervix (Pap test).  For women ages 21-65, health care providers may recommend a pelvic exam and a Pap test every three years. For women ages 28-65, they may recommend the Pap test and pelvic exam, combined with testing for human papilloma virus (HPV), every five years. Some types of HPV increase your risk of cervical cancer. Testing for HPV may also be done on women of  any age who have unclear Pap test results.  Other health care providers may not recommend any screening for nonpregnant women who are considered low risk for pelvic cancer and have no symptoms. Ask your health care provider if a screening pelvic exam is right for you.  If you have had past treatment for cervical cancer or a condition that could lead to cancer, you need Pap tests and screening for cancer for at least 20 years after your treatment. If Pap tests have been discontinued for you, your risk factors (such as having a new sexual partner) need to be reassessed to determine if you should  start having screenings again. Some women have medical problems that increase the chance of getting cervical cancer. In these cases, your health care provider may recommend that you have screening and Pap tests more often.  If you have a family history of uterine cancer or ovarian cancer, talk with your health care provider about genetic screening.  If you have vaginal bleeding after reaching menopause, tell your health care provider.  There are currently no reliable tests available to screen for ovarian cancer.  Lung Cancer Lung cancer screening is recommended for adults 55-66 years old who are at high risk for lung cancer because of a history of smoking. A yearly low-dose CT scan of the lungs is recommended if you:  Currently smoke.  Have a history of at least 30 pack-years of smoking and you currently smoke or have quit within the past 15 years. A pack-year is smoking an average of one pack of cigarettes per day for one year.  Yearly screening should:  Continue until it has been 15 years since you quit.  Stop if you develop a health problem that would prevent you from having lung cancer treatment.  Colorectal Cancer  This type of cancer can be detected and can often be prevented.  Routine colorectal cancer screening usually begins at age 46 and continues through age 97.  If you have risk factors for colon cancer, your health care provider may recommend that you be screened at an earlier age.  If you have a family history of colorectal cancer, talk with your health care provider about genetic screening.  Your health care provider may also recommend using home test kits to check for hidden blood in your stool.  A small camera at the end of a tube can be used to examine your colon directly (sigmoidoscopy or colonoscopy). This is done to check for the earliest forms of colorectal cancer.  Direct examination of the colon should be repeated every 5-10 years until age 83. However, if  early forms of precancerous polyps or small growths are found or if you have a family history or genetic risk for colorectal cancer, you may need to be screened more often.  Skin Cancer  Check your skin from head to toe regularly.  Monitor any moles. Be sure to tell your health care provider: ? About any new moles or changes in moles, especially if there is a change in a mole's shape or color. ? If you have a mole that is larger than the size of a pencil eraser.  If any of your family members has a history of skin cancer, especially at a young age, talk with your health care provider about genetic screening.  Always use sunscreen. Apply sunscreen liberally and repeatedly throughout the day.  Whenever you are outside, protect yourself by wearing long sleeves, pants, a wide-brimmed hat, and sunglasses.  What  should I know about osteoporosis? Osteoporosis is a condition in which bone destruction happens more quickly than new bone creation. After menopause, you may be at an increased risk for osteoporosis. To help prevent osteoporosis or the bone fractures that can happen because of osteoporosis, the following is recommended:  If you are 59-43 years old, get at least 1,000 mg of calcium and at least 600 mg of vitamin D per day.  If you are older than age 66 but younger than age 68, get at least 1,200 mg of calcium and at least 600 mg of vitamin D per day.  If you are older than age 18, get at least 1,200 mg of calcium and at least 800 mg of vitamin D per day.  Smoking and excessive alcohol intake increase the risk of osteoporosis. Eat foods that are rich in calcium and vitamin D, and do weight-bearing exercises several times each week as directed by your health care provider. What should I know about how menopause affects my mental health? Depression may occur at any age, but it is more common as you become older. Common symptoms of depression include:  Low or sad mood.  Changes in sleep  patterns.  Changes in appetite or eating patterns.  Feeling an overall lack of motivation or enjoyment of activities that you previously enjoyed.  Frequent crying spells.  Talk with your health care provider if you think that you are experiencing depression. What should I know about immunizations? It is important that you get and maintain your immunizations. These include:  Tetanus, diphtheria, and pertussis (Tdap) booster vaccine.  Influenza every year before the flu season begins.  Pneumonia vaccine.  Shingles vaccine.  Your health care provider may also recommend other immunizations. This information is not intended to replace advice given to you by your health care provider. Make sure you discuss any questions you have with your health care provider. Document Released: 08/21/2005 Document Revised: 01/17/2016 Document Reviewed: 04/02/2015 Elsevier Interactive Patient Education  2018 [employer].

## 2017-04-22 NOTE — Progress Notes (Signed)
error 

## 2017-04-23 LAB — HEPATITIS C ANTIBODY
Hepatitis C Ab: NONREACTIVE
SIGNAL TO CUT-OFF: 0.03 (ref <1.00)

## 2017-05-21 ENCOUNTER — Encounter: Payer: Self-pay | Admitting: Family Medicine

## 2017-05-21 ENCOUNTER — Ambulatory Visit: Payer: BC Managed Care – PPO | Admitting: Family Medicine

## 2017-05-21 VITALS — BP 120/82 | HR 69 | Temp 98.3°F | Ht 62.0 in | Wt 202.4 lb

## 2017-05-21 DIAGNOSIS — G8929 Other chronic pain: Secondary | ICD-10-CM | POA: Diagnosis not present

## 2017-05-21 DIAGNOSIS — F339 Major depressive disorder, recurrent, unspecified: Secondary | ICD-10-CM | POA: Diagnosis not present

## 2017-05-21 MED ORDER — DULOXETINE HCL 40 MG PO CPEP: 40.0000 mg | ORAL_CAPSULE | Freq: Every day | ORAL | 3 refills | Status: DC

## 2017-05-21 NOTE — Patient Instructions (Signed)
BEFORE YOU LEAVE: -follow up: 2 months  Increase the Cymbalta to 40 mg.  This was available and a 40 mg dose, I sent this to the pharmacy.  B12 1000 mcg daily  Vitamin D3 1000 international units daily  Get outside and walk daily for at least 10 minutes  Healthy low sugar diet.  Avoid sugars and processed foods.  Avoid simple starches such as rice, pasta and breads.  Avoid sweetened beverages or artificially sweetened beverages or foods.  Eat lots of colorful vegetables.  Fish is also good for the brain.  See the nutritionist as planned.  Consider counseling.  Follow-up sooner if any worsening or new concerns.

## 2017-05-21 NOTE — Progress Notes (Signed)
HPI:  Follow-up chronic pain in depression: -She has had extensive evaluation with several specialists in the past and was diagnosed with osteoarthritis and fibromyalgia -Started Cymbalta in October 2018 -Reports: pain almost completely resolved, mood improved some, still lack of motivation and feeling tired are biggest issues -seeing nutritionist starting next week -no regular exercise -reports tolerating medication well -See PHQ 9 -Denies suicidal ideation, thoughts of self-harm, hallucinations or severe symptoms  ROS: See pertinent positives and negatives per HPI.  Past Medical History:  Diagnosis Date  . Abnormal Pap smear of cervix ~1986   probable cryotherapy to cervix per patient  . Arthritis    saw Dr. Charlestine Night for polyarthralgia and chronic fatigue, ? fibromyalgia  . Diverticulitis 09/25/15   Noted on CT scan  . Fibromyalgia   . Headache    history of migraines  . History of mumps as a child   . Hyperlipidemia 08/16/15  . Leukocytosis    seen by rheumatology and oncology in the past and per pt felt weight related  . Low serum vitamin D 08/16/15  . Scarlet fever     Past Surgical History:  Procedure Laterality Date  . COLPOSCOPY  ~1986   Normal  . TONSILLECTOMY      Family History  Problem Relation Age of Onset  . Breast cancer Mother 88  . Alzheimer's disease Father 45  . Breast cancer Maternal Grandmother 22  . Colon cancer Neg Hx     Social History   Socioeconomic History  . Marital status: Divorced    Spouse name: None  . Number of children: None  . Years of education: None  . Highest education level: None  Social Needs  . Financial resource strain: None  . Food insecurity - worry: None  . Food insecurity - inability: None  . Transportation needs - medical: None  . Transportation needs - non-medical: None  Occupational History  . None  Tobacco Use  . Smoking status: Former Smoker    Packs/day: 0.50    Years: 36.00    Pack years: 18.00     Last attempt to quit: 05/17/2010    Years since quitting: 7.0  . Smokeless tobacco: Never Used  Substance and Sexual Activity  . Alcohol use: No    Alcohol/week: 0.0 oz  . Drug use: No  . Sexual activity: Not Currently    Birth control/protection: Post-menopausal, Surgical    Comment: TAH/BSO 09-05-14  Other Topics Concern  . None  Social History Narrative   Work or School: Stage manager Situation:      Spiritual Beliefs:      Lifestyle: admits likes sugar, soda, lots of dairy; no regular exercise        Current Outpatient Medications:  .  acetaminophen (TYLENOL) 500 MG tablet, Take 500 mg by mouth every 6 (six) hours as needed. Patient takes 500-1,000 mg prn, Disp: , Rfl:  .  DULoxetine 40 MG CPEP, Take 40 mg daily by mouth., Disp: 30 capsule, Rfl: 3 .  ibuprofen (ADVIL,MOTRIN) 200 MG tablet, Take 200 mg by mouth every 6 (six) hours as needed. Patient takes 200-400 mg prn, Disp: , Rfl:  .  KRILL OIL PO, Take by mouth., Disp: , Rfl:  .  OVER THE COUNTER MEDICATION, AREDS Preservision, Disp: , Rfl:  .  valACYclovir (VALTREX) 1000 MG tablet, Take 1,000 mg by mouth as needed (breakouts after taking antibiotics)., Disp: , Rfl:   EXAM:  Vitals:   05/21/17  1535  BP: 120/82  Pulse: 69  Temp: 98.3 F (36.8 C)    Body mass index is 37.02 kg/m.  GENERAL: vitals reviewed and listed above, alert, oriented, appears well hydrated and in no acute distress  HEENT: atraumatic, conjunttiva clear, no obvious abnormalities on inspection of external nose and ears  NECK: no obvious masses on inspection  LUNGS: clear to auscultation bilaterally, no wheezes, rales or rhonchi, good air movement  CV: HRRR, no peripheral edema  MS: moves all extremities without noticeable abnormality  PSYCH: pleasant and cooperative, no obvious depression or anxiety  ASSESSMENT AND PLAN:  Discussed the following assessment and plan:  Other chronic pain  Depression, recurrent  (HCC)  -Increased dose of Cymbalta to 40 mg -Consider counseling -Lifestyle recommendations -She asked about vitamins, see patient instructions -Follow-up 2 months, sooner as needed -return precautions discussed -Patient advised to return or notify a doctor immediately if symptoms worsen or persist or new concerns arise.  Patient Instructions  BEFORE YOU LEAVE: -follow up: 2 months  Increase the Cymbalta to 40 mg.  This was available and a 40 mg dose, I sent this to the pharmacy.  B12 1000 mcg daily  Vitamin D3 1000 international units daily  Get outside and walk daily for at least 10 minutes  Healthy low sugar diet.  Avoid sugars and processed foods.  Avoid simple starches such as rice, pasta and breads.  Avoid sweetened beverages or artificially sweetened beverages or foods.  Eat lots of colorful vegetables.  Fish is also good for the brain.  See the nutritionist as planned.  Consider counseling.  Follow-up sooner if any worsening or new concerns.      Colin Benton R., DO

## 2017-07-22 ENCOUNTER — Encounter: Payer: Self-pay | Admitting: Family Medicine

## 2017-09-15 ENCOUNTER — Ambulatory Visit (INDEPENDENT_AMBULATORY_CARE_PROVIDER_SITE_OTHER): Payer: BC Managed Care – PPO | Admitting: Ophthalmology

## 2017-09-15 DIAGNOSIS — H353132 Nonexudative age-related macular degeneration, bilateral, intermediate dry stage: Secondary | ICD-10-CM

## 2017-09-15 DIAGNOSIS — D3132 Benign neoplasm of left choroid: Secondary | ICD-10-CM

## 2017-09-15 DIAGNOSIS — H2513 Age-related nuclear cataract, bilateral: Secondary | ICD-10-CM

## 2017-09-15 DIAGNOSIS — H43813 Vitreous degeneration, bilateral: Secondary | ICD-10-CM

## 2017-09-15 DIAGNOSIS — D3131 Benign neoplasm of right choroid: Secondary | ICD-10-CM

## 2018-05-30 ENCOUNTER — Other Ambulatory Visit: Payer: Self-pay | Admitting: Family Medicine

## 2018-05-30 NOTE — Telephone Encounter (Signed)
Copied from [place] [id number]. Topic: Quick Communication - Rx Refill/Question >> May 30, 2018 10:05 AM Bea Graff, NT wrote: Medication: valACYclovir (VALTREX) 1000 MG tablet   Has the patient contacted their pharmacy? Yes.   (Agent: If no, request that the patient contact the pharmacy for the refill.) (Agent: If yes, when and what did the pharmacy advise?)  Preferred Pharmacy (with phone number or street name): [id number] [place] [phone]Phone) [phone](Fax)    Agent: Please be advised that RX refills may take up to 3 business days. We ask that you follow-up with your pharmacy.

## 2018-05-31 NOTE — Telephone Encounter (Signed)
Pt not seen in over 1 year. Please assist to schedule physical then ok to refill after appt scheduled.

## 2018-05-31 NOTE — Telephone Encounter (Signed)
Requested medication (s) are due for refill today:valacyclovir  Requested medication (s) are on the active medication list: yes  Last refill:  01/06/16  Future visit scheduled: no  Notes to clinic:  Historical provider    Requested Prescriptions  Pending Prescriptions Disp Refills   valACYclovir (VALTREX) 1000 MG tablet      Sig: Take 1 tablet (1,000 mg total) by mouth as needed (breakouts after taking antibiotics).     Antimicrobials:  Antiviral Agents - Anti-Herpetic Failed - 05/31/2018  9:38 AM      Failed - Valid encounter within last 12 months    Recent Outpatient Visits          1 year ago Other chronic pain   [facility], [place], DO   1 year ago Encounter for preventive health examination   [facility], Nickola Major, DO   1 year ago [place] [facility], Nickola Major, DO   2 years ago Other fatigue   [facility]rap[facility], [place], DO

## 2018-06-02 NOTE — Telephone Encounter (Signed)
I left a detailed message with the information below and asked that she call back for an appt.

## 2018-06-22 ENCOUNTER — Telehealth: Payer: Self-pay

## 2018-06-22 NOTE — Telephone Encounter (Signed)
Patient in 04 recall. Due for screening MMG. Please contact patient.

## 2018-06-23 NOTE — Telephone Encounter (Signed)
Left message to call Denise Munoz at [phone]. 

## 2018-07-04 NOTE — Telephone Encounter (Signed)
Left message to call Kaitlyn at [phone]. 

## 2018-07-11 NOTE — Telephone Encounter (Signed)
Letter to Dr.Jertson.

## 2018-07-11 NOTE — Telephone Encounter (Signed)
Letter mailed to patient's home address on file.  Routing to: Terence Lux, CMA  Encounter closed.

## 2018-07-26 ENCOUNTER — Ambulatory Visit: Payer: BC Managed Care – PPO | Admitting: Family Medicine

## 2018-08-24 ENCOUNTER — Other Ambulatory Visit: Payer: Self-pay | Admitting: Obstetrics and Gynecology

## 2018-08-24 ENCOUNTER — Other Ambulatory Visit: Payer: Self-pay

## 2018-08-24 DIAGNOSIS — R921 Mammographic calcification found on diagnostic imaging of breast: Secondary | ICD-10-CM

## 2018-08-25 ENCOUNTER — Telehealth: Payer: Self-pay

## 2018-08-25 NOTE — Telephone Encounter (Signed)
Spoke with patient and advised of Dr.Silva's recommendations. Patient states she has had a hysterectomy and doesn't feel she needs to come in yearly. Advised patient Dr.Silva hasn't seen her in 2 years and it is our practice to see patients routinely in order to give these orders. She stated this was "absurd". She made AEX appointment for 08-29-18 with Dr.Silva and then stated she would be looking for a new Gyn since we aren't concerned with her breast health. Routed to provider

## 2018-08-25 NOTE — Telephone Encounter (Signed)
Patient left voicemail returning call to Circle.

## 2018-08-25 NOTE — Telephone Encounter (Signed)
Received order to sign from [facility] for patient's Diag.MMG.Patient has not been seen in or office in 2 years. Per Dr.Silva, patient needs AEX prior to having Diag.MMG. Called patient to notify patient and schedule AEX. Left message on voicemail for patient to return my [phone].

## 2018-08-26 NOTE — Telephone Encounter (Signed)
I am very concerned about her breast health.  Our office has contacted the patient numerous times to recommend she follow through with recommendations for follow up mammogram.  Clinical breast exam is also part of her breast care.  She has not been examined in over three years.  Encounter closed.   Cc- Lamont Snowball

## 2018-08-26 NOTE — Progress Notes (Signed)
63 y.o. G52P1000 Unknown Caucasian female here for annual exam.    Patient has occasional vaginal spotting after exercise.  Vaginal bleeding occurs once a week.  She thinks it is coming from the vagina.  She notices this in her underwear.   States she has a hemorrhoid and can bleed following a hike from this.  She has had this for about a year.   Fecal incontinence once in a while when she overexerts.  Told by her PCP to avoid dairy.   Denies dysuria.  Has some frequency like if she is under stress.  PCP:   Colin Benton, DO  Patient's last menstrual period was 05/13/2006.           Sexually active: No.  The current method of family planning is post menopausal status/Hysterectomy.    Exercising: No.  The patient does not participate in regular exercise at present. Smoker:  Quit 2011  Health Maintenance: Pap: 08-19-15 Neg:Neg HR HPV,05-21-14 Neg:Neg HR HPV History of abnormal Pap:  Yes, Hx conization in 1980's  MMG: 03-12-17 Abnormal Diag/Pt.had Bx Rt.Br.--See Epic Colonoscopy: 10-19-15 benign polyp;next 10/2025 BMD:   n/a  Result  n/a TDaP: PCP Gardasil:   no HIV:no Hep C: 04-22-17 Neg Screening Labs:  Hb today: PCP   reports that she quit smoking about 8 years ago. She has a 18.00 pack-year smoking history. She has never used smokeless tobacco. She reports that she does not drink alcohol or use drugs.  Past Medical History:  Diagnosis Date  . Abnormal Pap smear of cervix ~1986   probable cryotherapy to cervix per patient  . Arthritis    saw Dr. Charlestine Night for polyarthralgia and chronic fatigue, ? fibromyalgia  . Diverticulitis 09/25/15   Noted on CT scan  . Fibromyalgia   . Headache    history of migraines  . History of mumps as a child   . Hyperlipidemia 08/16/15  . IBS (irritable bowel syndrome)   . Leukocytosis    seen by rheumatology and oncology in the past and per pt felt weight related  . Low serum vitamin D 08/16/15  . Scarlet fever     Past Surgical History:   Procedure Laterality Date  . ABDOMINAL HYSTERECTOMY N/A 07/17/2014   Procedure: for fibroids, HYSTERECTOMY ABDOMINAL;  Surgeon: Jamey Reas de Berton Lan, MD;  Location: [facility];  Service: Gynecology;  Laterality: N/A;  . COLPOSCOPY  ~1986   Normal  . SALPINGOOPHORECTOMY Bilateral 07/17/2014   Procedure: SALPINGO OOPHORECTOMY with collection of pelvic washings;  Surgeon: Jamey Reas de Berton Lan, MD;  Location: [facility];  Service: Gynecology;  Laterality: Bilateral;  . TONSILLECTOMY      Current Outpatient Medications  Medication Sig Dispense Refill  . ibuprofen (ADVIL,MOTRIN) 200 MG tablet Take 200 mg by mouth every 6 (six) hours as needed. Patient takes 200-400 mg prn    . OVER THE COUNTER MEDICATION AREDS Preservision    . valACYclovir (VALTREX) 1000 MG tablet Take 1,000 mg by mouth as needed (breakouts after taking antibiotics).     No current facility-administered medications for this visit.     Family History  Problem Relation Age of Onset  . Breast cancer Mother 80  . Alzheimer's disease Father 48  . Breast cancer Maternal Grandmother 58  . Colon cancer Neg Hx     Review of Systems  Genitourinary:       Fecal incontinence   All other systems reviewed and are negative.   Exam:  BP 118/82 (BP Location: Right Arm, Patient Position: Sitting, Cuff Size: Large)   Pulse 70   Resp 16   Ht 5' 1.5" (1.562 m)   Wt 212 lb 9.6 oz (96.4 kg)   LMP 05/13/2006   BMI 39.52 kg/m     General appearance: alert, cooperative and appears stated age Head: Normocephalic, without obvious abnormality, atraumatic Neck: no adenopathy, supple, symmetrical, trachea midline and thyroid normal to inspection and palpation Lungs: clear to auscultation bilaterally Breasts: normal appearance, no masses or tenderness, No nipple retraction or dimpling, No nipple discharge or bleeding, No axillary or supraclavicular adenopathy Heart: regular rate and rhythm Abdomen: soft,  non-tender; no masses, no organomegaly Extremities: extremities normal, atraumatic, no cyanosis or edema Skin: Skin color, texture, turgor normal. No rashes or lesions Lymph nodes: Cervical, supraclavicular, and axillary nodes normal. No abnormal inguinal nodes palpated Neurologic: Grossly normal  Pelvic: External genitalia:  no lesions              Urethra:  normal appearing urethra with no masses, tenderness or lesions              Bartholins and Skenes: normal                 Vagina: normal appearing vagina with atrophy noted, no lesions, no granulation tissue.              Cervix:  absent              Pap taken: Yes.   Bimanual Exam:  Uterus:   Absent.              Adnexa: no mass, fullness, tenderness              Rectal exam: Yes.  .  Confirms.              Anus:  normal sphincter tone, hemorrhoid with fissure in it.   Chaperone was present for exam.  Assessment:   Well woman visit with normal exam. Vaginal bleeding post hysterectomy.  Status post TAH/BSO.   Benign cervix on final pathology.  Hx cervical dysplasia in remote past.  Vaginal atrophy.  Vaginal bleeding? Status post right breast biopsy with fibrocystic change, adenosis and calcifications.  Overdue for dx breast imaging.  Fecal incontinence.  Hemorrhoid with fissure.  Plan: Mammogram bilateral dx.  Recommended self breast awareness. Pap and HR HPV as above. Guidelines for Calcium, Vitamin D, regular exercise program including cardiovascular and weight bearing exercise. Metamucil. IFOB.  She will follow up with GI regarding her fissure.  Urine dip with micro and culture if abnormal. Follow up annually and prn.   After visit summary provided.

## 2018-08-29 ENCOUNTER — Other Ambulatory Visit (HOSPITAL_COMMUNITY)
Admission: RE | Admit: 2018-08-29 | Discharge: 2018-08-29 | Disposition: A | Discharge status: Home / Self Care | Payer: BC Managed Care – PPO | Source: Ambulatory Visit | Attending: Obstetrics and Gynecology | Admitting: Obstetrics and Gynecology

## 2018-08-29 ENCOUNTER — Other Ambulatory Visit: Payer: Self-pay

## 2018-08-29 ENCOUNTER — Encounter: Payer: Self-pay | Admitting: Obstetrics and Gynecology

## 2018-08-29 ENCOUNTER — Ambulatory Visit: Payer: BC Managed Care – PPO | Admitting: Obstetrics and Gynecology

## 2018-08-29 VITALS — BP 118/82 | HR 70 | Resp 16 | Ht 61.5 in | Wt 212.6 lb

## 2018-08-29 DIAGNOSIS — R350 Frequency of micturition: Secondary | ICD-10-CM

## 2018-08-29 DIAGNOSIS — Z1211 Encounter for screening for malignant neoplasm of colon: Secondary | ICD-10-CM | POA: Diagnosis not present

## 2018-08-29 DIAGNOSIS — Z01419 Encounter for gynecological examination (general) (routine) without abnormal findings: Secondary | ICD-10-CM

## 2018-08-29 DIAGNOSIS — R319 Hematuria, unspecified: Secondary | ICD-10-CM

## 2018-08-29 NOTE — Patient Instructions (Signed)

## 2018-08-30 LAB — URINALYSIS, MICROSCOPIC ONLY: Casts: NONE SEEN /lpf

## 2018-08-31 LAB — URINE CULTURE

## 2018-08-31 LAB — CYTOLOGY - PAP
DIAGNOSIS: NEGATIVE
HPV: NOT DETECTED

## 2018-09-01 ENCOUNTER — Telehealth: Payer: Self-pay

## 2018-09-01 ENCOUNTER — Ambulatory Visit
Admission: RE | Admit: 2018-09-01 | Discharge: 2018-09-01 | Disposition: A | Discharge status: Home / Self Care | Payer: BC Managed Care – PPO | Source: Ambulatory Visit | Attending: Obstetrics and Gynecology | Admitting: Obstetrics and Gynecology

## 2018-09-01 DIAGNOSIS — R921 Mammographic calcification found on diagnostic imaging of breast: Secondary | ICD-10-CM

## 2018-09-01 LAB — POCT URINALYSIS DIPSTICK
BILIRUBIN UA: NEGATIVE
Glucose, UA: NEGATIVE
Ketones, UA: NEGATIVE
Leukocytes, UA: NEGATIVE
Nitrite, UA: NEGATIVE
Protein, UA: NEGATIVE
Urobilinogen, UA: 0.2 E.U./dL
pH, UA: 5 (ref 5.0–8.0)

## 2018-09-01 MED ORDER — SULFAMETHOXAZOLE-TRIMETHOPRIM 800-160 MG PO TABS: 1.0000 | ORAL_TABLET | Freq: Two times a day (BID) | ORAL | 0 refills | Status: DC

## 2018-09-01 NOTE — Telephone Encounter (Signed)
-----   Message from Nunzio Cobbs, MD sent at 09/01/2018  6:40 AM EST ----- Please report results of her urine culture which does show E Coli bacteria. I am recommending she take Bactrim DS po bid x 3 days.   Please share results of her pap which are normal with negative HR HPV testing.  Pap recall - 02.  I am highlighting this result so you know to contact the patient.

## 2018-09-01 NOTE — Telephone Encounter (Signed)
Called patient and per DPR, left detailed message stating urine culture showed E.Coli bacteria and will send Rx for Bactrim DS po bid x3 days to her pharmacy on file. Her pap was normal with negative HR HPV and she can call with any questions.

## 2018-09-17 LAB — SPECIMEN STATUS REPORT

## 2018-09-17 LAB — FECAL OCCULT BLOOD, IMMUNOCHEMICAL: Fecal Occult Bld: POSITIVE — AB

## 2018-09-20 ENCOUNTER — Telehealth: Payer: Self-pay | Admitting: *Deleted

## 2018-09-20 DIAGNOSIS — R195 Other fecal abnormalities: Secondary | ICD-10-CM

## 2018-09-20 NOTE — Telephone Encounter (Signed)
Patient returned call to nurse [name].

## 2018-09-20 NOTE — Telephone Encounter (Signed)
-----   Message from Nunzio Cobbs, MD sent at 09/18/2018  9:00 PM EDT ----- Please inform patient of her IFOB which was positive for blood.  Please refer her to her gastroenterologist for blood in her stool.

## 2018-09-20 NOTE — Telephone Encounter (Signed)
Notes recorded by Burnice Logan, RN on 09/20/2018 at 9:27 AM EDT Left message to call [name], RN at [facility].

## 2018-09-20 NOTE — Telephone Encounter (Signed)
Spoke with patient, advised as seen below per Dr. Quincy Simmonds. Patient has seen Dr. Carlean Purl in the past, request to return to [facility]. Order placed for referral. Advised our referral coordinator will f/u with appt details once scheduled.  Patient verbalizes understanding and is agreeable.   Ambulatory referral to GI placed.   Routing to [employer] .    Encounter closed.

## 2018-09-21 ENCOUNTER — Encounter (INDEPENDENT_AMBULATORY_CARE_PROVIDER_SITE_OTHER): Payer: BC Managed Care – PPO | Admitting: Ophthalmology

## 2018-10-13 ENCOUNTER — Telehealth: Payer: Self-pay | Admitting: Internal Medicine

## 2018-10-13 NOTE — Telephone Encounter (Signed)
LVM TO CHANGE VISIT TO A WEBEX OR ZOOM SAME TIME.Marland Kitchen

## 2018-10-25 ENCOUNTER — Ambulatory Visit: Payer: BC Managed Care – PPO | Admitting: Internal Medicine

## 2018-12-23 ENCOUNTER — Encounter: Payer: Self-pay | Admitting: *Deleted

## 2019-06-22 ENCOUNTER — Encounter: Payer: Self-pay | Admitting: Family Medicine

## 2019-06-22 ENCOUNTER — Other Ambulatory Visit: Payer: Self-pay

## 2019-06-22 ENCOUNTER — Telehealth (INDEPENDENT_AMBULATORY_CARE_PROVIDER_SITE_OTHER): Payer: BC Managed Care – PPO | Admitting: Family Medicine

## 2019-06-22 VITALS — Temp 97.6°F

## 2019-06-22 DIAGNOSIS — R310 Gross hematuria: Secondary | ICD-10-CM | POA: Diagnosis not present

## 2019-06-22 DIAGNOSIS — R300 Dysuria: Secondary | ICD-10-CM | POA: Diagnosis not present

## 2019-06-22 MED ORDER — NITROFURANTOIN MONOHYD MACRO 100 MG PO CAPS: 100.0000 mg | ORAL_CAPSULE | Freq: Two times a day (BID) | ORAL | 0 refills | Status: DC

## 2019-06-22 NOTE — Progress Notes (Signed)
Virtual Visit via Video Note  I connected with Denise Munoz  on 06/22/19 at 12:20 PM EST by a video enabled telemedicine application and verified that I am speaking with the correct person using two identifiers.  Location patient: home Location provider:work or home office Persons participating in the virtual visit: patient, provider  I discussed the limitations of evaluation and management by telemedicine and the availability of in person appointments. The patient expressed understanding and agreed to proceed.   HPI:  Acute visit for dysuria: -intermittent, mild the last 2 weeks -mild dysuria - pressure with urination and hematuria -she has had some urinary frequency and urgency mild  -denies:denies abd/pelvic pain, fevers, nausea vomiting, vaginal discharge, sexual activity -hx of hysterectomy for fibroids -hx of UTI in the past and this feels similar -she has seen her gynecologist in the last year and had an exam -reports had seem some discolored urine a few times in the past she thought was related to a red supplement pill she takes for her eye, reports gyn aware of this -she plans to see her GI doc about some loose stools on and off ROS: See pertinent positives and negatives per HPI.  Past Medical History:  Diagnosis Date  . Abnormal Pap smear of cervix ~1986   probable cryotherapy to cervix per patient  . Arthritis    saw Dr. Charlestine Night for polyarthralgia and chronic fatigue, ? fibromyalgia  . Diverticulitis 09/25/15   Noted on CT scan  . Fibromyalgia   . Headache    history of migraines  . History of mumps as a child   . Hyperlipidemia 08/16/15  . IBS (irritable bowel syndrome)   . Leukocytosis    seen by rheumatology and oncology in the past and per pt felt weight related  . Low serum vitamin D 08/16/15  . Scarlet fever     Past Surgical History:  Procedure Laterality Date  . ABDOMINAL HYSTERECTOMY N/A 07/17/2014   Procedure: for fibroids, HYSTERECTOMY ABDOMINAL;  Surgeon: Jamey Reas de Berton Lan, MD;  Location: [facility];  Service: Gynecology;  Laterality: N/A;  . COLPOSCOPY  ~1986   Normal  . SALPINGOOPHORECTOMY Bilateral 07/17/2014   Procedure: SALPINGO OOPHORECTOMY with collection of pelvic washings;  Surgeon: Jamey Reas de Berton Lan, MD;  Location: [facility];  Service: Gynecology;  Laterality: Bilateral;  . TONSILLECTOMY      Family History  Problem Relation Age of Onset  . Breast cancer Mother 11  . Alzheimer's disease Father 46  . Breast cancer Maternal Grandmother 44  . Colon cancer Neg Hx     SOCIAL HX: see hpi   Current Outpatient Medications:  .  OVER THE COUNTER MEDICATION, AREDS Preservision, Disp: , Rfl:  .  valACYclovir (VALTREX) 1000 MG tablet, Take 1,000 mg by mouth as needed (breakouts after taking antibiotics)., Disp: , Rfl:  .  nitrofurantoin, macrocrystal-monohydrate, (MACROBID) 100 MG capsule, Take 1 capsule (100 mg total) by mouth 2 (two) times daily., Disp: 14 capsule, Rfl: 0  EXAM:  VITALS per patient if applicable:  GENERAL: alert, oriented, appears well and in no acute distress  HEENT: atraumatic, conjunttiva clear, no obvious abnormalities on inspection of external nose and ears  NECK: normal movements of the head and neck  LUNGS: on inspection no signs of respiratory distress, breathing rate appears normal, no obvious gross SOB, gasping or wheezing  CV: no obvious cyanosis  MS: moves all visible extremities without noticeable abnormality  PSYCH/NEURO: pleasant and cooperative, no  obvious depression or anxiety, speech and thought processing grossly intact  ASSESSMENT AND PLAN:  Discussed the following assessment and plan:  Dysuria  Gross hematuria  -we discussed possible serious and likely etiologies, options for evaluation and workup, limitations of telemedicine visit vs in person visit, treatment, treatment risks and precautions. Pt prefers to treat via telemedicine empirically rather then  risking or undertaking an in person visit at this moment. Acute symptoms c/w likely UTI. Advised urine studies, ? urology evaluation given possible underlying urine discoloration in the past. She prefer empiric treatment with macrobid 113m bid now and declined OV at this time. She agrees to follow up with our practice to establish with a PCP and consider urine/urology referral then or if any further symptoms after treatment. Patient agrees to seek prompt in person care if worsening, new symptoms arise, or if is not improving with treatment.   I discussed the assessment and treatment plan with the patient. The patient was provided an opportunity to ask questions and all were answered. The patient agreed with the plan and demonstrated an understanding of the instructions.   The patient was advised to call back or seek an in-person evaluation if the symptoms worsen or if the condition fails to improve as anticipated.   HLucretia Kern DO

## 2019-07-25 ENCOUNTER — Telehealth: Payer: Self-pay | Admitting: Obstetrics and Gynecology

## 2019-07-25 NOTE — Telephone Encounter (Signed)
Left message on voicemail to call and reschedule cancelled appointment. °

## 2019-09-08 ENCOUNTER — Ambulatory Visit: Payer: BC Managed Care – PPO | Admitting: Obstetrics and Gynecology

## 2019-12-06 ENCOUNTER — Other Ambulatory Visit: Payer: Self-pay | Admitting: Obstetrics and Gynecology

## 2019-12-06 DIAGNOSIS — Z1231 Encounter for screening mammogram for malignant neoplasm of breast: Secondary | ICD-10-CM

## 2019-12-12 ENCOUNTER — Ambulatory Visit
Admission: RE | Admit: 2019-12-12 | Discharge: 2019-12-12 | Disposition: A | Discharge status: Home / Self Care | Payer: BC Managed Care – PPO | Source: Ambulatory Visit

## 2019-12-12 ENCOUNTER — Other Ambulatory Visit: Payer: Self-pay

## 2019-12-12 DIAGNOSIS — Z1231 Encounter for screening mammogram for malignant neoplasm of breast: Secondary | ICD-10-CM

## 2020-05-02 ENCOUNTER — Telehealth: Payer: BC Managed Care – PPO | Admitting: Family Medicine

## 2020-05-02 DIAGNOSIS — R42 Dizziness and giddiness: Secondary | ICD-10-CM

## 2020-05-02 NOTE — Progress Notes (Signed)
Virtual Visit via Video Note  I connected with Denise Munoz  on 05/02/20 at  3:40 PM EDT by a video enabled telemedicine application and verified that I am speaking with the correct person using two identifiers.  Location patient: [place] Location provider:work or home office Persons participating in the virtual visit: patient, provider   I discussed the limitations of evaluation and management by telemedicine and the availability of in person appointments. The patient expressed understanding and agreed to proceed.   HPI:  Acute telemedicine visit for occasional dizzy feeling: -Onset: 2-3 months ago, occurs a few times per week or month, brief when goes to stand up or moves quickly suddenly - lasts a few seconds -Denies: ha, nausea, vomiting, vision changes, SOB, CP, hearing loss, sinus issues - but some mild allergies -walks and jogs for 1-2 miles several days per -Pertinent medication allergies: doxy   ROS: See pertinent positives and negatives per HPI.  Past Medical History:  Diagnosis Date  . Abnormal Pap smear of cervix ~1986   probable cryotherapy to cervix per patient  . Arthritis    saw Dr. Charlestine Night for polyarthralgia and chronic fatigue, ? fibromyalgia  . Diverticulitis 09/25/15   Noted on CT scan  . Fibromyalgia   . Headache    history of migraines  . History of mumps as a child   . Hyperlipidemia 08/16/15  . IBS (irritable bowel syndrome)   . Leukocytosis    seen by rheumatology and oncology in the past and per pt felt weight related  . Low serum vitamin D 08/16/15  . Scarlet fever     Past Surgical History:  Procedure Laterality Date  . ABDOMINAL HYSTERECTOMY N/A 07/17/2014   Procedure: for fibroids, HYSTERECTOMY ABDOMINAL;  Surgeon: Jamey Reas de Berton Lan, MD;  Location: [facility];  Service: Gynecology;  Laterality: N/A;  . COLPOSCOPY  ~1986   Normal  . SALPINGOOPHORECTOMY Bilateral 07/17/2014   Procedure: SALPINGO OOPHORECTOMY with collection of pelvic washings;   Surgeon: Jamey Reas de Berton Lan, MD;  Location: [facility];  Service: Gynecology;  Laterality: Bilateral;  . TONSILLECTOMY       Current Outpatient Medications:  .  nitrofurantoin, macrocrystal-monohydrate, (MACROBID) 100 MG capsule, Take 1 capsule (100 mg total) by mouth 2 (two) times daily., Disp: 14 capsule, Rfl: 0 .  OVER THE COUNTER MEDICATION, AREDS Preservision, Disp: , Rfl:  .  valACYclovir (VALTREX) 1000 MG tablet, Take 1,000 mg by mouth as needed (breakouts after taking antibiotics)., Disp: , Rfl:   EXAM:  VITALS per patient if applicable:  GENERAL: alert, oriented, appears well and in no acute distress  HEENT: atraumatic, conjunttiva clear, no obvious abnormalities on inspection of external nose and ears  NECK: normal movements of the head and neck  LUNGS: on inspection no signs of respiratory distress, breathing rate appears normal, no obvious gross SOB, gasping or wheezing  CV: no obvious cyanosis  MS: moves all visible extremities without noticeable abnormality  PSYCH/NEURO: pleasant and cooperative, no obvious depression or anxiety, speech and thought processing grossly intact  ASSESSMENT AND PLAN:  Discussed the following assessment and plan:  Vertigo  -we discussed possible serious and likely etiologies, options for evaluation and workup, limitations of telemedicine visit vs in person visit, treatment, treatment risks and precautions. Pt prefers to treat via telemedicine empirically rather than in person at this moment. Discussed possible BPPV given classic symptoms vs other. She wants to try home treatment for this, provided link for Dr. Richrd Sox  BPPV treatment. Advised not to drive with Vertigo. Advised will need inperson follow up to ensure resolved. She currently does not have PCP. Discussed options and sent link to schedulers to assist in scheduling a new patient visit. Discussed options of acute inperson care if does not resolve or any worsening  in the interim.  Advised to seek promp in person care if worsening, new symptoms arise, or if is not improving with treatment. Did let this patient know that I only do telemedicine on Tuesdays and Thursdays for [place]. Advised to schedule follow up visit with PCP or [facility] if any further questions or concerns to avoid delays in care.   I discussed the assessment and treatment plan with the patient. The patient was provided an opportunity to ask questions and all were answered. The patient agreed with the plan and demonstrated an understanding of the instructions.     Denise Kern, DO

## 2020-05-02 NOTE — Patient Instructions (Signed)
Please see the link below for more information and a treatment for vertigo.  [email or web address]  Please do not drive with vertigo.  I hope you are feeling better soon! Seek care promptly if your symptoms worsen, new concerns arise or you are not improving with treatment.

## 2020-05-06 NOTE — Progress Notes (Signed)
Dr. Maudie Mercury advised will need inperson follow up to ensure resolved. She currently does not have PCP. Discussed options and sent link to schedulers to assist in scheduling a new patient visit. Discussed options of acute inperson care if does not resolve or any worsening in the interim.  LMVM for the patient to contact the office to schedule a New Patient appointment with Dr. Ethlyn Gallery.

## 2020-05-24 ENCOUNTER — Telehealth: Payer: BC Managed Care – PPO | Admitting: Family Medicine

## 2020-05-24 DIAGNOSIS — E538 Deficiency of other specified B group vitamins: Secondary | ICD-10-CM

## 2020-05-24 DIAGNOSIS — R739 Hyperglycemia, unspecified: Secondary | ICD-10-CM

## 2020-05-24 DIAGNOSIS — R899 Unspecified abnormal finding in specimens from other organs, systems and tissues: Secondary | ICD-10-CM

## 2020-05-24 DIAGNOSIS — R5382 Chronic fatigue, unspecified: Secondary | ICD-10-CM

## 2020-05-24 DIAGNOSIS — M2550 Pain in unspecified joint: Secondary | ICD-10-CM

## 2020-05-24 DIAGNOSIS — W57XXXS Bitten or stung by nonvenomous insect and other nonvenomous arthropods, sequela: Secondary | ICD-10-CM

## 2020-05-24 DIAGNOSIS — R350 Frequency of micturition: Secondary | ICD-10-CM

## 2020-05-24 DIAGNOSIS — E559 Vitamin D deficiency, unspecified: Secondary | ICD-10-CM

## 2020-05-24 DIAGNOSIS — E785 Hyperlipidemia, unspecified: Secondary | ICD-10-CM

## 2020-05-24 DIAGNOSIS — N3000 Acute cystitis without hematuria: Secondary | ICD-10-CM

## 2020-05-24 DIAGNOSIS — D72828 Other elevated white blood cell count: Secondary | ICD-10-CM

## 2020-05-24 NOTE — Progress Notes (Signed)
Virtual Visit via Video Note  I connected with Denise Munoz  on 05/24/20 at 11:00 AM EST by a video enabled telemedicine application and verified that I am speaking with the correct person using two identifiers.  Location patient: home Location provider:work office Persons participating in the virtual visit: patient, provider  I discussed the limitations of evaluation and management by telemedicine and the availability of in person appointments. The patient expressed understanding and agreed to proceed.   Denise Munoz DOB: September 17, 1955 Encounter date: 05/24/2020  This is a 64 y.o. female who presents to establish care. No chief complaint on file.   History of present illness: Was patient of Dr. Maudie Mercury. Had virtual visit with Dr. Maudie Mercury in October. Sx not better. Has had light headedness, brain fog/buzzy head. If she moved quickly a couple of times she felt dizzy but this was momentary. Did try to do the technique for vertigo but it didn't help.   Worried about blood sugar creeping up. Diet has not been good in last 18 mo. Chronic fatigue seems to be worsening. Light headedness in last 3-4 months; not all the time, but becoming more. Every once in awhile she wakes up and notes a sweet, fruity smell. Very exhausted. Vision not clear, but does have readers. Always hungry - not sure if nerves or not, but can't get satiated. Has cut down on meat products - eat mostly plant based but with that comes more carbs and states "not necessarily good carbs".   Does try to drink throughout day; does drink a lot of water. Does drink sodas and not diet. Gets up 2-3 times at night to urinate. Has been this way for about a year. Has not had sugar issues in past, but knows that she is taking in a lot of sugar with drinks.   Weight has not changed. Trying to walk 3-5x/week. But not losing weight. Since 2018 has been working on walking regularly/walk to run.   Occasional headache - more dull; doesn't last  long. Usually posterior head. Not sure if sinus related. Usually not bad enough to need to take medication.   Fatigue worsens after activity to point where she has to rest after activity. Or has to avoid activity next day. Has more muscle soreness, muscle aches than she had in past and has flares in joints. Tries to watch diet for what triggers pain, but can't always connect. Notes most in hands - heat, swelling. Feels like body is inflammed. Some days feels so stiff she can't move. Some joints feel warm. Does feel like this is happening more frequently and lasts a couple of days at a time when it occurs.   She was on generic form of cymbalta starting at 105m and after 1 mo visit was increased to 461m The 4072meemed to make her sleepy. Didn't stay with it very long; insurance didn't cover this well and it was pricey for her.   Still has hard time with motivation. Feels she has more anxiety than depression at this point. Was also on lexapro about 15 years but made her feel like couch potato.   If she takes even just 1.5mg70m melatonin she will have "sleep hangover" next day.   Does have dysuria, frequency that flares every couple of months. Doesn't feel she empties bladder completely.   She has severe pain episodes where she misses work for a couple of days. In past, with ampicillin and valtrex then she feels better/pain free for a couple  of months. If she has beef; she can feel joint pain by end of day that sticks around.   Past Medical History:  Diagnosis Date  . Abnormal Pap smear of cervix ~1986   probable cryotherapy to cervix per patient  . Arthritis    saw Dr. Charlestine Night for polyarthralgia and chronic fatigue, ? fibromyalgia  . Diverticulitis 09/25/15   Noted on CT scan  . Fibromyalgia   . Headache    history of migraines  . History of mumps as a child   . Hyperlipidemia 08/16/15  . IBS (irritable bowel syndrome)   . Leukocytosis    seen by rheumatology and oncology in the past and per  pt felt weight related  . Low serum vitamin D 08/16/15  . Scarlet fever    Past Surgical History:  Procedure Laterality Date  . ABDOMINAL HYSTERECTOMY N/A 07/17/2014   Procedure: for fibroids, HYSTERECTOMY ABDOMINAL;  Surgeon: Jamey Reas de Berton Lan, MD;  Location: [facility];  Service: Gynecology;  Laterality: N/A;  . COLPOSCOPY  ~1986   Normal  . SALPINGOOPHORECTOMY Bilateral 07/17/2014   Procedure: SALPINGO OOPHORECTOMY with collection of pelvic washings;  Surgeon: Jamey Reas de Berton Lan, MD;  Location: [facility];  Service: Gynecology;  Laterality: Bilateral;  . TONSILLECTOMY     Allergies  Allergen Reactions  . Doxycycline Nausea Only    diarrhea    No outpatient medications have been marked as taking for the 05/24/20 encounter (Appointment) with Caren Macadam, MD.   Social History   Tobacco Use  . Smoking status: Former Smoker    Packs/day: 0.50    Years: 36.00    Pack years: 18.00    Quit date: 05/17/2010    Years since quitting: 10.0  . Smokeless tobacco: Never Used  Substance Use Topics  . Alcohol use: No    Alcohol/week: 0.0 standard drinks   Family History  Problem Relation Age of Onset  . Breast cancer Mother 28  . Alzheimer's disease Father 79  . Breast cancer Maternal Grandmother 63  . Colon cancer Neg Hx      Review of Systems  Constitutional: Positive for fatigue. Negative for chills and fever.  Respiratory: Negative for cough, chest tightness, shortness of breath and wheezing.   Cardiovascular: Negative for chest pain, palpitations and leg swelling.  Musculoskeletal: Positive for arthralgias.  Neurological: Positive for headaches.    Objective:  LMP 05/13/2006       BP Readings from Last 3 Encounters:  08/29/18 118/82  05/21/17 120/82  04/22/17 100/80   Wt Readings from Last 3 Encounters:  08/29/18 212 lb 9.6 oz (96.4 kg)  05/21/17 202 lb 6.4 oz (91.8 kg)  04/22/17 206 lb 9.6 oz (93.7 kg)    EXAM:  GENERAL:  alert, oriented, appears well and in no acute distress  HEENT: atraumatic, conjunctiva clear, no obvious abnormalities on inspection of external nose and ears  NECK: normal movements of the head and neck  LUNGS: on inspection no signs of respiratory distress, breathing rate appears normal, no obvious gross SOB, gasping or wheezing  CV: no obvious cyanosis  MS: moves all visible extremities without noticeable abnormality  PSYCH/NEURO: pleasant and cooperative, no obvious depression or anxiety, speech and thought processing grossly intact  Assessment/Plan  1. Hyperlipidemia, unspecified hyperlipidemia type - Comprehensive metabolic panel; Future - Lipid panel; Future  2. Chronic fatigue - CBC with Differential/Platelet; Future - TSH; Future  3. Arthralgia, unspecified joint - ANA; Future - Rheumatoid  factor; Future - Sedimentation rate; Future - C-reactive protein; Future - Uric acid; Future  4. Hyperglycemia - Hemoglobin A1c; Future  5. B12 deficiency - Vitamin B12; Future  6. Vitamin D deficiency - VITAMIN D 25 Hydroxy (Vit-D Deficiency, Fractures); Future  7. Tick bite, unspecified site, sequela - B. Burgdorfi Antibodies; Future  8. Urinary frequency - Urinalysis with Culture Reflex; Future   Return for pending bloodwork.   I discussed the assessment and treatment plan with the patient. The patient was provided an opportunity to ask questions and all were answered. The patient agreed with the plan and demonstrated an understanding of the instructions.   The patient was advised to call back or seek an in-person evaluation if the symptoms worsen or if the condition fails to improve as anticipated.  I provided 35 minutes of non-face-to-face time during this encounter.   Micheline Rough, MD

## 2020-05-27 ENCOUNTER — Other Ambulatory Visit: Payer: Self-pay | Admitting: Family Medicine

## 2020-05-27 LAB — URINALYSIS W MICROSCOPIC + REFLEX CULTURE
Bacteria, UA: NONE SEEN /HPF
Bilirubin Urine: NEGATIVE
Glucose, UA: NEGATIVE
Hyaline Cast: NONE SEEN /LPF
Ketones, ur: NEGATIVE
Nitrites, Initial: NEGATIVE
Protein, ur: NEGATIVE
RBC / HPF: NONE SEEN /HPF (ref 0–2)
Specific Gravity, Urine: 1.006 (ref 1.001–1.03)
pH: 6 (ref 5.0–8.0)

## 2020-05-27 LAB — URINE CULTURE
MICRO NUMBER:: 11198812
SPECIMEN QUALITY:: ADEQUATE

## 2020-05-27 LAB — CULTURE INDICATED

## 2020-05-27 MED ORDER — AMPICILLIN 500 MG PO CAPS: 500.0000 mg | ORAL_CAPSULE | Freq: Four times a day (QID) | ORAL | 0 refills | Status: DC

## 2020-05-28 ENCOUNTER — Telehealth: Payer: Self-pay

## 2020-05-28 NOTE — Telephone Encounter (Signed)
Patient called in when the office was closed at [place] returning a call to Wendie Simmer. Requesting a call back today after 3pm if possible   [phone]  Please call and advise

## 2020-05-28 NOTE — Telephone Encounter (Signed)
See results note. 

## 2020-05-28 NOTE — Telephone Encounter (Signed)
Pt returned the call to the office and stated that she is in meetings all day and will be available after 3:00 p.m. and you can reach her at [phone].

## 2020-05-28 NOTE — Addendum Note (Signed)
Addended by: Agnes Lawrence on: 05/28/2020 05:11 PM   Modules accepted: Orders

## 2020-05-30 LAB — CBC WITH DIFFERENTIAL/PLATELET
Absolute Monocytes: 1041 cells/uL — ABNORMAL HIGH (ref 200–950)
Basophils Absolute: 89 cells/uL (ref 0–200)
Basophils Relative: 0.7 %
Eosinophils Absolute: 178 cells/uL (ref 15–500)
Eosinophils Relative: 1.4 %
HCT: 46.5 % — ABNORMAL HIGH (ref 35.0–45.0)
Hemoglobin: 15.5 g/dL (ref 11.7–15.5)
Lymphs Abs: 3188 cells/uL (ref 850–3900)
MCH: 28.7 pg (ref 27.0–33.0)
MCHC: 33.3 g/dL (ref 32.0–36.0)
MCV: 86.1 fL (ref 80.0–100.0)
MPV: 9.5 fL (ref 7.5–12.5)
Monocytes Relative: 8.2 %
Neutro Abs: 8204 cells/uL — ABNORMAL HIGH (ref 1500–7800)
Neutrophils Relative %: 64.6 %
Platelets: 288 10*3/uL (ref 140–400)
RBC: 5.4 10*6/uL — ABNORMAL HIGH (ref 3.80–5.10)
RDW: 13 % (ref 11.0–15.0)
Total Lymphocyte: 25.1 %
WBC: 12.7 10*3/uL — ABNORMAL HIGH (ref 3.8–10.8)

## 2020-05-30 LAB — ALPHA-GAL PANEL
Beef IgE: <0.1 kU/L (ref <0.35)
Class: 0
Class: 0
Class: 0
Galactose-alpha-1,3-galactose IgE: <0.1 kU/L (ref <0.10)
LAMB/MUTTON IGE: <0.1 kU/L (ref <0.35)
Pork IgE: <0.1 kU/L (ref <0.35)

## 2020-05-30 LAB — VITAMIN B12: Vitamin B-12: 532 pg/mL (ref 200–1100)

## 2020-05-30 LAB — COMPREHENSIVE METABOLIC PANEL
AG Ratio: 1.2 (calc) (ref 1.0–2.5)
ALT: 35 U/L — ABNORMAL HIGH (ref 6–29)
AST: 36 U/L — ABNORMAL HIGH (ref 10–35)
Albumin: 4.1 g/dL (ref 3.6–5.1)
Alkaline phosphatase (APISO): 132 U/L (ref 37–153)
BUN: 14 mg/dL (ref 7–25)
CO2: 25 mmol/L (ref 20–32)
Calcium: 9.2 mg/dL (ref 8.6–10.4)
Chloride: 103 mmol/L (ref 98–110)
Creat: 0.54 mg/dL (ref 0.50–0.99)
Globulin: 3.5 g/dL (calc) (ref 1.9–3.7)
Glucose, Bld: 71 mg/dL (ref 65–99)
Potassium: 3.6 mmol/L (ref 3.5–5.3)
Sodium: 138 mmol/L (ref 135–146)
Total Bilirubin: 0.5 mg/dL (ref 0.2–1.2)
Total Protein: 7.6 g/dL (ref 6.1–8.1)

## 2020-05-30 LAB — LIPID PANEL
Cholesterol: 210 mg/dL — ABNORMAL HIGH (ref <200)
HDL: 44 mg/dL — ABNORMAL LOW (ref >=50)
LDL Cholesterol (Calc): 126 mg/dL (calc) — ABNORMAL HIGH
Non-HDL Cholesterol (Calc): 166 mg/dL (calc) — ABNORMAL HIGH (ref <130)
Total CHOL/HDL Ratio: 4.8 (calc) (ref <5.0)
Triglycerides: 262 mg/dL — ABNORMAL HIGH (ref <150)

## 2020-05-30 LAB — ANA: Anti Nuclear Antibody (ANA): POSITIVE — AB

## 2020-05-30 LAB — ANTI-NUCLEAR AB-TITER (ANA TITER)
ANA TITER: 1:80 {titer} — ABNORMAL HIGH
ANA Titer 1: 1:80 {titer} — ABNORMAL HIGH

## 2020-05-30 LAB — SEDIMENTATION RATE: Sed Rate: 31 mm/h — ABNORMAL HIGH (ref 0–30)

## 2020-05-30 LAB — RHEUMATOID FACTOR: Rheumatoid fact SerPl-aCnc: <14 IU/mL (ref <14)

## 2020-05-30 LAB — URIC ACID: Uric Acid, Serum: 5.3 mg/dL (ref 2.5–7.0)

## 2020-05-30 LAB — HEMOGLOBIN A1C
Hgb A1c MFr Bld: 5.4 % of total Hgb (ref <5.7)
Mean Plasma Glucose: 108 (calc)
eAG (mmol/L): 6 (calc)

## 2020-05-30 LAB — B. BURGDORFI ANTIBODIES: B burgdorferi Ab IgG+IgM: <0.9 index

## 2020-05-30 LAB — VITAMIN D 25 HYDROXY (VIT D DEFICIENCY, FRACTURES): Vit D, 25-Hydroxy: 23 ng/mL — ABNORMAL LOW (ref 30–100)

## 2020-05-30 LAB — TSH: TSH: 2.33 mIU/L (ref 0.40–4.50)

## 2020-05-30 LAB — C-REACTIVE PROTEIN: CRP: 27.3 mg/L — ABNORMAL HIGH (ref <8.0)

## 2020-05-31 ENCOUNTER — Other Ambulatory Visit: Payer: Self-pay | Admitting: Family Medicine

## 2020-05-31 MED ORDER — DULOXETINE HCL 30 MG PO CPEP
30.0000 mg | ORAL_CAPSULE | Freq: Every day | ORAL | 1 refills | Status: DC
Start: 2020-05-31 — End: 2020-07-17

## 2020-05-31 NOTE — Addendum Note (Signed)
Addended by: Agnes Lawrence on: 05/31/2020 10:01 AM   Modules accepted: Orders

## 2020-06-05 ENCOUNTER — Telehealth: Payer: Self-pay | Admitting: Family Medicine

## 2020-06-05 ENCOUNTER — Other Ambulatory Visit: Payer: Self-pay | Admitting: Family Medicine

## 2020-06-05 MED ORDER — VALACYCLOVIR HCL 1 G PO TABS
1000.0000 mg | ORAL_TABLET | Freq: Every day | ORAL | 1 refills | Status: DC
Start: 2020-06-05 — End: 2021-03-03

## 2020-06-05 NOTE — Telephone Encounter (Signed)
Pt call and stated she need a refill on

## 2020-06-05 NOTE — Telephone Encounter (Signed)
Patient called back and stated that she takes 1000 mg 1x a day, please advise. CB is [phone]

## 2020-06-05 NOTE — Telephone Encounter (Signed)
Pt call and want a refill on valACYclovir (VALTREX) 1000 MG tablet sent to  [id number]- [place], [place] - [address]:  [phone] Fax:  [phone]

## 2020-06-05 NOTE — Telephone Encounter (Signed)
Left a detailed message for the pt to call back with back with information as to how she takes Valacyclovir.

## 2020-06-05 NOTE — Telephone Encounter (Signed)
Refill sent.

## 2020-06-05 NOTE — Telephone Encounter (Signed)
Can you call and confirm dosing so we send in right amount? thanks

## 2020-06-05 NOTE — Telephone Encounter (Signed)
Pt called back and was not sure of the dosage but she is going to call her pharmacy and then give [place] a call back with that information.

## 2020-06-13 ENCOUNTER — Other Ambulatory Visit: Payer: Self-pay

## 2020-06-14 ENCOUNTER — Other Ambulatory Visit: Payer: BC Managed Care – PPO

## 2020-06-14 ENCOUNTER — Other Ambulatory Visit: Payer: Self-pay

## 2020-06-20 ENCOUNTER — Other Ambulatory Visit: Payer: Self-pay

## 2020-06-20 ENCOUNTER — Other Ambulatory Visit: Payer: BC Managed Care – PPO

## 2020-06-20 DIAGNOSIS — N3000 Acute cystitis without hematuria: Secondary | ICD-10-CM

## 2020-06-21 LAB — URINE CULTURE
MICRO NUMBER:: 11297911
SPECIMEN QUALITY:: ADEQUATE

## 2020-07-01 NOTE — Addendum Note (Signed)
Addended by: Agnes Lawrence on: 07/01/2020 04:54 PM   Modules accepted: Orders

## 2020-07-02 ENCOUNTER — Other Ambulatory Visit: Payer: BC Managed Care – PPO

## 2020-07-15 NOTE — Progress Notes (Signed)
Office Visit Note  Patient: Denise Munoz             Date of Birth: 1955/12/09           MRN: [id number]             PCP: Caren Macadam, MD Referring: Caren Macadam, MD Visit Date: 07/16/2020 Occupation: [employer] administrator  Subjective:  No chief complaint on file.   History of Present Illness: Denise Munoz is a 65 y.o. female here for evaluation of positive ANA and elevated CRP with chronic fatigue, arthralgias, difficulty concentrating. She has had pain in upper and lower extremities for years but lately her arms are a much greater problem. She has good and bad periods of symptoms, sometimes more involvement of the shoulders and sometimes more distal involvement around the hands and wrists. She does not notice obvious skin rashes or joint swelling. The pain is sometimes burning and sometimes deep aching pain. She is limited in exercise by severe exhaustion lasting up to a few days after exerting herself. Her headaches are improved since menopause. She has IBS mixed symptoms tend to worsen when in overall exacerbation. She has cervical lymphadenopathy with some regularity, ever since a bad case of mumps as an adolescent. She denies significant raynaud's, pleurisy, or history of blood clots.  Labs reviewed 05/2020 ANA 1:80 homogenous, speckled CRP 27.3 ESR 31 RF neg Vit D 23   Activities of Daily Living:  Patient reports morning stiffness for all day. Patient Reports nocturnal pain.  Difficulty dressing/grooming: Denies Difficulty climbing stairs: Reports Difficulty getting out of chair: Denies Difficulty using hands for taps, buttons, cutlery, and/or writing: Reports  Review of Systems  Constitutional: Positive for fatigue.  HENT: Negative for mouth sores, mouth dryness and nose dryness.   Eyes: Positive for dryness. Negative for pain and itching.  Respiratory: Positive for shortness of breath. Negative for difficulty breathing.    Cardiovascular: Negative for chest pain and palpitations.  Gastrointestinal: Negative for blood in stool, constipation and diarrhea.  Endocrine: Negative for increased urination.  Genitourinary: Positive for nocturia. Negative for difficulty urinating.  Musculoskeletal: Positive for arthralgias, joint pain, joint swelling, myalgias, muscle weakness, morning stiffness, muscle tenderness and myalgias.  Skin: Negative for color change, rash and redness.  Allergic/Immunologic: Negative for susceptible to infections.  Neurological: Positive for weakness. Negative for dizziness, numbness, headaches and memory loss.  Hematological: Negative for bruising/bleeding tendency.  Psychiatric/Behavioral: Negative for confusion.    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 Charlestine Night for polyarthralgia and chronic fatigue, ? fibromyalgia   Diverticulitis 09/25/15   Noted on CT scan   Fibromyalgia    Headache    history of migraines   History of mumps as a child    Hyperlipidemia 08/16/15   IBS (irritable bowel syndrome)    Leukocytosis    seen by rheumatology  and oncology in the past and per pt felt weight related   Low serum vitamin D 08/16/15   Scarlet fever     Family History  Problem Relation Age of Onset   Breast cancer Mother 9   Alzheimer's disease Father 56    Bladder Cancer Brother    Breast cancer Maternal Grandmother 37   Healthy Son    Colon cancer Neg Hx    Past Surgical History:  Procedure Laterality Date   ABDOMINAL HYSTERECTOMY N/A 07/17/2014   Procedure: for fibroids, HYSTERECTOMY ABDOMINAL;  Surgeon: Everardo All Amundson de Berton Lan, MD;  Location: [facility];  Service: Gynecology;  Laterality: N/A;   COLPOSCOPY  ~1986   Normal   SALPINGOOPHORECTOMY Bilateral 07/17/2014   Procedure: SALPINGO OOPHORECTOMY with collection of pelvic washings;  Surgeon: Jamey Reas de Berton Lan, MD;  Location: [facility];  Service: Gynecology;  Laterality: Bilateral;   TONSILLECTOMY     Social History   Social History Narrative   Work or School: Investment banker, corporate      Home Situation:      Spiritual Beliefs:      Lifestyle: admits likes sugar, soda, lots of dairy; no regular exercise      Immunization History  Administered Date(s) Administered   PFIZER SARS-COV-2 Vaccination 09/22/2019, 10/13/2019, 07/04/2020   Tdap 06/13/2011     Objective: Vital Signs: BP (!) 158/90 (BP Location: Right Arm, Patient Position: Sitting, Cuff Size: Large)    Pulse 78    Resp 16    Ht 5' 1.75" (1.568 m)    Wt 212 lb 12.8 oz (96.5 kg)    LMP 05/13/2006    BMI 39.24 kg/m    Physical Exam HENT:     Right Ear: External ear normal.     Left Ear: External ear normal.     Mouth/Throat:     Mouth: Mucous membranes are moist.     Pharynx: Oropharynx is clear.  Eyes:     Conjunctiva/sclera: Conjunctivae normal.  Cardiovascular:     Rate and Rhythm: Normal rate and regular rhythm.  Pulmonary:     Effort: Pulmonary effort is normal.     Breath sounds: Normal breath sounds.  Skin:    General: Skin is warm and dry.  Neurological:     General: No focal deficit present.     Mental Status: She is alert.  Psychiatric:        Mood and Affect: Mood normal.     Musculoskeletal Exam:  Neck full range of motion Shoulder, elbow, wrist, full range of  motion no tenderness or swelling Heberdon's nodes present on hands bilaterally, no synovitis Knees, ankles, full range of motion no tenderness or swelling 1st MTP bunions present no swelling   Investigation: No additional findings.  Imaging: No results found.  Recent Labs: Lab Results  Component Value Date   WBC 12.7 (H) 05/24/2020   HGB 15.5 05/24/2020   PLT 288 05/24/2020   NA 138 05/24/2020   K 3.6 05/24/2020   CL 103 05/24/2020   CO2 25 05/24/2020   GLUCOSE 71 05/24/2020   BUN 14 05/24/2020   CREATININE 0.54 05/24/2020   BILITOT 0.5 05/24/2020   ALKPHOS 115 04/22/2017   AST 36 (H) 05/24/2020   ALT 35 (H) 05/24/2020   PROT 7.6 05/24/2020   ALBUMIN 4.0 04/22/2017   CALCIUM 9.2 05/24/2020   GFRAA >90 07/18/2014    Speciality Comments: No specialty comments available.  Procedures:  No procedures performed  Allergies: Doxycycline   Assessment / Plan:     Visit Diagnoses: Chronic fatigue Fibromyalgia Chronic pain of multiple joints  Positive ANA and moderately elevated inflammatory markers but at this time no clinical evidence of active systemic connective tissue disease. She does have myofascial pain and fibromyalgia symptoms in addition to osteoarthritis notable. We can reevalaute if new symptoms develop otherwise encouraged reviewing self-care for FMS including sleep, exercise, diet, and stress management.  Vitamin D insufficiency  Vitamin D level 23 in November discussed recommendation to continue supplementation and also recommended follow up with PCP for staying up to date with bone densitometry.  Orders: No orders of the defined types were placed in this encounter.  No orders of the defined types were placed in this encounter.   Follow-Up Instructions: Return if symptoms worsen or fail to improve.   Collier Salina, MD  Note - This record has been created using [employer].  Chart creation errors have been sought, but may not always  have  been located. Such creation errors do not reflect on  the standard of medical care.

## 2020-07-16 ENCOUNTER — Encounter: Payer: Self-pay | Admitting: Internal Medicine

## 2020-07-16 ENCOUNTER — Other Ambulatory Visit: Payer: Self-pay

## 2020-07-16 ENCOUNTER — Ambulatory Visit: Payer: BC Managed Care – PPO | Admitting: Internal Medicine

## 2020-07-16 VITALS — BP 158/90 | HR 78 | Resp 16 | Ht 61.75 in | Wt 212.8 lb

## 2020-07-16 DIAGNOSIS — M19042 Primary osteoarthritis, left hand: Secondary | ICD-10-CM

## 2020-07-16 DIAGNOSIS — G8929 Other chronic pain: Secondary | ICD-10-CM

## 2020-07-16 DIAGNOSIS — M19041 Primary osteoarthritis, right hand: Secondary | ICD-10-CM

## 2020-07-16 DIAGNOSIS — M2550 Pain in unspecified joint: Secondary | ICD-10-CM

## 2020-07-16 DIAGNOSIS — R5382 Chronic fatigue, unspecified: Secondary | ICD-10-CM

## 2020-07-16 DIAGNOSIS — M797 Fibromyalgia: Secondary | ICD-10-CM

## 2020-07-16 DIAGNOSIS — E559 Vitamin D deficiency, unspecified: Secondary | ICD-10-CM

## 2020-07-16 NOTE — Patient Instructions (Signed)
I recommend checking out the [facility] patient-centered guide for fibromyalgia and chronic pain management: https://www.olsen-oconnell.com/  I recommend asking your primary care doctor about considering bone density screening for osteoporosis with your history of arthritis and vitamin D deficiency since this can be common in postmenopausal women.   Myofascial Pain Syndrome and Fibromyalgia Myofascial pain syndrome and fibromyalgia are both pain disorders. This pain may be felt mainly in your muscles.  Fibromyalgia: ? Has muscle pains and tenderness that come and go. ? Is often associated with fatigue and sleep problems. ? Tends to be long-lasting (chronic), but is not life-threatening. Fibromyalgia and myofascial pain syndrome are not the same. However, they often occur together. If you have both conditions, each can make the other worse. Both are common and can cause enough pain and fatigue to make day-to-day activities difficult. Both can be hard to diagnose because their symptoms are common in many other conditions. What are the causes? The exact causes of these conditions are not known. What increases the risk? You are more likely to develop this condition if:  You have a family history of the condition.  You have certain triggers, such as: ? Spine disorders. ? An injury (trauma) or other physical stressors. ? Being under a lot of stress. ? Medical conditions such as osteoarthritis, rheumatoid arthritis, or lupus. What are the signs or symptoms? Fibromyalgia The main symptom of fibromyalgia is widespread pain and tenderness in your muscles. Pain is sometimes described as stabbing, shooting, or burning. You may also have:  Tingling or numbness.  Sleep problems and fatigue.  Problems with attention and concentration (fibro fog). Other symptoms may include:  Bowel and bladder problems.  Headaches.  Visual problems.  Problems with odors and noises.  Depression or  mood changes.  Painful menstrual periods (dysmenorrhea).  Dry skin or eyes. These symptoms can vary over time. How is this diagnosed? This condition may be diagnosed by your symptoms and medical history. You will also have a physical exam. In general:  Fibromyalgia is diagnosed if you have pain, fatigue, and other symptoms for more than 3 months, and symptoms cannot be explained by another condition. How is this treated? Treatment for these conditions depends on the type that you have.  For fibromyalgia: ? Pain medicines, such as NSAIDs. ? Medicines for treating depression. ? Medicines for treating seizures. ? Medicines that relax the muscles. Treating these conditions often requires a team of health care providers. These may include:  Your primary care provider.  Physical therapist.  Complementary health care providers, such as massage therapists or acupuncturists.  Psychiatrist for cognitive behavioral therapy. Follow these instructions at home: Medicines  Take over-the-counter and prescription medicines only as told by your health care provider.  Do not drive or use heavy machinery while taking prescription pain medicine.  If you are taking prescription pain medicine, take actions to prevent or treat constipation. Your health care provider may recommend that you: ? Drink enough fluid to keep your urine pale yellow. ? Eat foods that are high in fiber, such as fresh fruits and vegetables, whole grains, and beans. ? Limit foods that are high in fat and processed sugars, such as fried or sweet foods. ? Take an over-the-counter or prescription medicine for constipation. Lifestyle   Exercise as directed by your health care provider or physical therapist.  Practice relaxation techniques to control your stress. You may want to try: ? Biofeedback. ? Visual imagery. ? Hypnosis. ? Muscle relaxation. ? Yoga. ?  Meditation.  Maintain a healthy lifestyle. This includes eating  a healthy diet and getting enough sleep.  Do not use any products that contain nicotine or tobacco, such as cigarettes and e-cigarettes. If you need help quitting, ask your health care provider. General instructions  Talk to your health care provider about complementary treatments, such as acupuncture or massage.  Consider joining a support group with others who are diagnosed with this condition.  Do not do activities that stress or strain your muscles. This includes repetitive motions and heavy lifting.  Keep all follow-up visits as told by your health care provider. This is important. Where to find more information  National Fibromyalgia Association: www.fmaware.[place]: www.arthritis.org  American Chronic Pain Association: www.theacpa.org Contact a health care provider if:  You have new symptoms.  Your symptoms get worse or your pain is severe.  You have side effects from your medicines.  You have trouble sleeping.  Your condition is causing depression or anxiety. Summary  Myofascial pain syndrome and fibromyalgia are pain disorders.  Myofascial pain syndrome has tender points in the muscle that will cause pain when pressed (trigger points). Fibromyalgia also has muscle pains and tenderness that come and go, but this condition is often associated with fatigue and sleep disturbances.  Fibromyalgia and myofascial pain syndrome are not the same but often occur together, causing pain and fatigue that make day-to-day activities difficult.  Treatment for fibromyalgia includes taking medicines to relax the muscles and medicines for pain, depression, or seizures. Treatment for myofascial pain syndrome includes taking medicines for pain, cooling and stretching of muscles, and injecting medicines into trigger points.  Follow your health care provider's instructions for taking medicines and maintaining a healthy lifestyle.      Osteoarthritis  Osteoarthritis  is a type of arthritis that affects tissue that covers the ends of bones in joints (cartilage). Cartilage acts as a cushion between the bones and helps them move smoothly. Osteoarthritis results when cartilage in the joints gets worn down. Osteoarthritis is sometimes called "wear and tear" arthritis. Osteoarthritis is the most common form of arthritis. It often occurs in older people. It is a condition that gets worse over time (a progressive condition). Joints that are most often affected by this condition are in:  Fingers.  Toes.  Hips.  Knees.  Spine, including neck and lower back. What are the causes? This condition is caused by age-related wearing down of cartilage that covers the ends of bones. What increases the risk? The following factors may make you more likely to develop this condition:  Older age.  Being overweight or obese.  Overuse of joints, such as in athletes.  Past injury of a joint.  Past surgery on a joint.  Family history of osteoarthritis. What are the signs or symptoms? The main symptoms of this condition are pain, swelling, and stiffness in the joint. The joint may lose its shape over time. Small pieces of bone or cartilage may break off and float inside of the joint, which may cause more pain and damage to the joint. Small deposits of bone (osteophytes) may grow on the edges of the joint. Other symptoms may include:  A grating or scraping feeling inside the joint when you move it.  Popping or creaking sounds when you move. Symptoms may affect one or more joints. Osteoarthritis in a major joint, such as your knee or hip, can make it painful to walk or exercise. If you have osteoarthritis in your hands, you might  not be able to grip items, twist your hand, or control small movements of your hands and fingers (fine motor skills). How is this diagnosed? This condition may be diagnosed based on:  Your medical history.  A physical exam.  Your  symptoms.  X-rays of the affected joint(s).  Blood tests to rule out other types of arthritis. How is this treated? There is no cure for this condition, but treatment can help to control pain and improve joint function. Treatment plans may include:  A prescribed exercise program that allows for rest and joint relief. You may work with a physical therapist.  A weight control plan.  Pain relief techniques, such as: ? Applying heat and cold to the joint. ? Electric pulses delivered to nerve endings under the skin (transcutaneous electrical nerve stimulation, or TENS). ? Massage. ? Certain nutritional supplements.  NSAIDs or prescription medicines to help relieve pain.  Medicine to help relieve pain and inflammation (corticosteroids). This can be given by mouth (orally) or as an injection.  Assistive devices, such as a brace, wrap, splint, specialized glove, or cane.  Surgery, such as: ? An osteotomy. This is done to reposition the bones and relieve pain or to remove loose pieces of bone and cartilage. ? Joint replacement surgery. You may need this surgery if you have very bad (advanced) osteoarthritis. Follow these instructions at home: Activity  Rest your affected joints as directed by your health care provider.  Do not drive or use heavy machinery while taking prescription pain medicine.  Exercise as directed. Your health care provider or physical therapist may recommend specific types of exercise, such as: ? Strengthening exercises. These are done to strengthen the muscles that support joints that are affected by arthritis. They can be performed with weights or with exercise bands to add resistance. ? Aerobic activities. These are exercises, such as brisk walking or water aerobics, that get your heart pumping. ? Range-of-motion activities. These keep your joints easy to move. ? Balance and agility exercises. Managing pain, stiffness, and swelling      If directed, apply  heat to the affected area as often as told by your health care provider. Use the heat source that your health care provider recommends, such as a moist heat pack or a heating pad. ? If you have a removable assistive device, remove it as told by your health care provider. ? Place a towel between your skin and the heat source. If your health care provider tells you to keep the assistive device on while you apply heat, place a towel between the assistive device and the heat source. ? Leave the heat on for 20-30 minutes. ? Remove the heat if your skin turns bright red. This is especially important if you are unable to feel pain, heat, or cold. You may have a greater risk of getting burned.  If directed, put ice on the affected joint: ? If you have a removable assistive device, remove it as told by your health care provider. ? Put ice in a plastic bag. ? Place a towel between your skin and the bag. If your health care provider tells you to keep the assistive device on during icing, place a towel between the assistive device and the bag. ? Leave the ice on for 20 minutes, 2-3 times a day. General instructions  Take over-the-counter and prescription medicines only as told by your health care provider.  Maintain a healthy weight. Follow instructions from your health care provider for weight control.  These may include dietary restrictions.  Do not use any products that contain nicotine or tobacco, such as cigarettes and e-cigarettes. These can delay bone healing. If you need help quitting, ask your health care provider.  Use assistive devices as directed by your health care provider.  Keep all follow-up visits as told by your health care provider. This is important. Where to find more information  [employer] of Arthritis and Musculoskeletal and Skin Diseases: www.niams.[employer].es  [employer] on Aging: http://kim-miller.com/  American College of Rheumatology: www.rheumatology.org Contact a  health care provider if:  Your skin turns red.  You develop a rash.  You have pain that gets worse.  You have a fever along with joint or muscle aches. Get help right away if:  You lose a lot of weight.  You suddenly lose your appetite.  You have night sweats. Summary  Osteoarthritis is a type of arthritis that affects tissue covering the ends of bones in joints (cartilage).  This condition is caused by age-related wearing down of cartilage that covers the ends of bones.  The main symptom of this condition is pain, swelling, and stiffness in the joint.  There is no cure for this condition, but treatment can help to control pain and improve joint function.

## 2020-07-17 ENCOUNTER — Other Ambulatory Visit: Payer: Self-pay | Admitting: Family Medicine

## 2020-07-17 ENCOUNTER — Telehealth: Payer: Self-pay | Admitting: Family Medicine

## 2020-07-17 MED ORDER — DULOXETINE HCL 20 MG PO CPEP: 20.0000 mg | ORAL_CAPSULE | Freq: Every day | ORAL | 0 refills | Status: DC

## 2020-07-17 NOTE — Telephone Encounter (Signed)
Patient informed of the message below.

## 2020-07-17 NOTE — Telephone Encounter (Signed)
Pt is calling stating that the Rx dulolxetine (CYMBALTA) 30 MG it is a little to strong for her and would like to see if the dosage can be reduce she is unable to tolerate the 30 MG and work her way back to the 30 MG.  Pt would like to have a call back.

## 2020-07-17 NOTE — Telephone Encounter (Signed)
Lowest dose is 83m. I have sent this in for her. I would advise taking in evening as this may help with adjusting to medication. If she still cannot tolerate let me know.

## 2020-08-07 ENCOUNTER — Ambulatory Visit: Payer: BC Managed Care – PPO | Admitting: Family Medicine

## 2020-08-09 ENCOUNTER — Other Ambulatory Visit: Payer: Self-pay | Admitting: Family Medicine

## 2020-10-04 ENCOUNTER — Other Ambulatory Visit: Payer: Self-pay

## 2020-10-04 ENCOUNTER — Encounter (INDEPENDENT_AMBULATORY_CARE_PROVIDER_SITE_OTHER): Payer: BC Managed Care – PPO | Admitting: Ophthalmology

## 2020-10-04 DIAGNOSIS — H353132 Nonexudative age-related macular degeneration, bilateral, intermediate dry stage: Secondary | ICD-10-CM | POA: Diagnosis not present

## 2020-10-04 DIAGNOSIS — H43813 Vitreous degeneration, bilateral: Secondary | ICD-10-CM

## 2020-10-04 DIAGNOSIS — D3132 Benign neoplasm of left choroid: Secondary | ICD-10-CM

## 2020-10-04 DIAGNOSIS — D3131 Benign neoplasm of right choroid: Secondary | ICD-10-CM

## 2020-10-04 DIAGNOSIS — H2513 Age-related nuclear cataract, bilateral: Secondary | ICD-10-CM

## 2021-02-27 ENCOUNTER — Other Ambulatory Visit: Payer: Self-pay | Admitting: Obstetrics and Gynecology

## 2021-02-27 DIAGNOSIS — Z1231 Encounter for screening mammogram for malignant neoplasm of breast: Secondary | ICD-10-CM

## 2021-03-03 ENCOUNTER — Other Ambulatory Visit: Payer: Self-pay

## 2021-03-03 ENCOUNTER — Ambulatory Visit (INDEPENDENT_AMBULATORY_CARE_PROVIDER_SITE_OTHER): Payer: BC Managed Care – PPO | Admitting: Family Medicine

## 2021-03-03 ENCOUNTER — Other Ambulatory Visit (HOSPITAL_COMMUNITY)
Admission: RE | Admit: 2021-03-03 | Discharge: 2021-03-03 | Disposition: A | Discharge status: Home / Self Care | Payer: BC Managed Care – PPO | Source: Ambulatory Visit | Attending: Family Medicine | Admitting: Family Medicine

## 2021-03-03 ENCOUNTER — Encounter: Payer: Self-pay | Admitting: Family Medicine

## 2021-03-03 VITALS — BP 116/90 | HR 89 | Temp 97.7°F | Ht 61.75 in | Wt 212.2 lb

## 2021-03-03 DIAGNOSIS — R319 Hematuria, unspecified: Secondary | ICD-10-CM | POA: Insufficient documentation

## 2021-03-03 MED ORDER — AMPICILLIN 500 MG PO CAPS: 500.0000 mg | ORAL_CAPSULE | Freq: Four times a day (QID) | ORAL | 0 refills | Status: AC

## 2021-03-03 NOTE — Progress Notes (Signed)
Denise Munoz DOB: Oct 03, 1955 Encounter date: 03/03/2021  This is a 65 y.o. female who presents with Chief Complaint  Patient presents with   Hematuria    Intermittently x2 years, sensation of "pressure" with urination, seen by GYN years ago and no diagnosis given   Dizziness    Intermittently x6 months    History of present illness:  Went to gyn right before COVID. Hysterectomy about  6 years ago. Just intermittent with hematuria. Urgency, pressure and then every once in awhile gets spot in underwear which is a little orange in color. Usually notes more at work and second urine of day; notes more at office when leaning forward for work. More orange in color; not that she sees blood in urine. Probably notes the color change about every week. Spotting in underwear every 2-3 weeks. Pressure for last 2 weeks on and off.   Notes more painful when drinking a lot of water - increased pressure and pain when she urinates. Has been working on healthier eating, cutting out sugars.   No abd pain, no back pain, no fevers.   Taking AREDs eye vitamin, vitamin D. Takes B every couple of days.   Did urine for gyn; some blood per her recollection.  Brother just diagnosed with bladder cancer; nonsmoker.   Took self off cymbalta - makes her want to sleep all the time, lack of motivation. Even on the 70m she felt bad. It did help with her fibromyalgia pain, but not worth the side effects. Also had tremors.   Allergies  Allergen Reactions   Cymbalta [Duloxetine Hcl]     Tremors, fatigue, lack of motivation   Lexapro [Escitalopram]     Fatigue, lack of motivation   Doxycycline Nausea Only    diarrhea    Current Meds  Medication Sig   Cholecalciferol (VITAMIN D) 125 MCG (5000 UT) CAPS    Cyanocobalamin (VITAMIN B 12) 100 MCG LOZG    OVER THE COUNTER MEDICATION AREDS Preservision    Review of Systems  Constitutional:  Negative for chills, fatigue and fever.  Respiratory:  Negative for  cough, chest tightness, shortness of breath and wheezing.   Cardiovascular:  Negative for chest pain, palpitations and leg swelling.  Genitourinary:  Positive for frequency and urgency. Negative for difficulty urinating, dysuria, vaginal discharge and vaginal pain.   Objective:  BP 116/90 (BP Location: Left Arm, Patient Position: Sitting, Cuff Size: Large)   Pulse 89   Temp 97.7 F (36.5 C) (Oral)   Ht 5' 1.75" (1.568 m)   Wt 212 lb 3.2 oz (96.3 kg)   LMP 05/13/2006   SpO2 95%   BMI 39.13 kg/m   Weight: 212 lb 3.2 oz (96.3 kg)   BP Readings from Last 3 Encounters:  03/03/21 116/90  07/16/20 (!) 158/90  08/29/18 118/82   Wt Readings from Last 3 Encounters:  03/03/21 212 lb 3.2 oz (96.3 kg)  07/16/20 212 lb 12.8 oz (96.5 kg)  08/29/18 212 lb 9.6 oz (96.4 kg)    Physical Exam Constitutional:      Appearance: She is well-developed.  Cardiovascular:     Rate and Rhythm: Normal rate and regular rhythm.     Heart sounds: Normal heart sounds.  Pulmonary:     Effort: Pulmonary effort is normal. No respiratory distress.     Breath sounds: Normal breath sounds. No rales.  Abdominal:     General: Bowel sounds are normal. There is no distension.     Palpations: Abdomen  is soft. There is no mass.     Tenderness: There is abdominal tenderness (suprapubic). There is no guarding or rebound.  Genitourinary:    Urethra: No prolapse, urethral swelling or urethral lesion.    Assessment/Plan  1. Hematuria, unspecified type Start with ua/culture. Consider urology referral. Keep up with water intake.  - Urinalysis with Culture Reflex - Cytology, Non-Gyn [[facility]]; Future    Return for pending lab results.     Micheline Rough, MD

## 2021-03-03 NOTE — Addendum Note (Signed)
Addended by: Amanda Cockayne on: 03/03/2021 01:54 PM   Modules accepted: Orders

## 2021-03-04 LAB — CYTOLOGY - NON PAP

## 2021-03-06 LAB — URINALYSIS W MICROSCOPIC + REFLEX CULTURE
Bacteria, UA: NONE SEEN /HPF
Bilirubin Urine: NEGATIVE
Glucose, UA: NEGATIVE
Hgb urine dipstick: NEGATIVE
Ketones, ur: NEGATIVE
Leukocyte Esterase: NEGATIVE
Nitrites, Initial: NEGATIVE
Specific Gravity, Urine: 1.024 (ref 1.001–1.035)
WBC, UA: NONE SEEN /HPF (ref 0–5)
pH: 5.5 (ref 5.0–8.0)

## 2021-03-06 LAB — URINE CULTURE
MICRO NUMBER:: 12277701
SPECIMEN QUALITY:: ADEQUATE

## 2021-03-06 LAB — CULTURE INDICATED

## 2021-03-07 ENCOUNTER — Telehealth: Payer: Self-pay

## 2021-03-07 NOTE — Telephone Encounter (Signed)
Patient returned call and was informed of results pt verbalized understanding, pt would like diflucan 157m sent to pharmacy

## 2021-03-10 MED ORDER — FLUCONAZOLE 150 MG PO TABS: 150.0000 mg | ORAL_TABLET | Freq: Once | ORAL | 0 refills | Status: AC

## 2021-03-10 NOTE — Addendum Note (Signed)
Addended by: Agnes Lawrence on: 03/10/2021 08:48 AM   Modules accepted: Orders

## 2021-03-10 NOTE — Telephone Encounter (Signed)
Rx done. 

## 2021-04-18 ENCOUNTER — Ambulatory Visit
Admission: RE | Admit: 2021-04-18 | Discharge: 2021-04-18 | Disposition: A | Discharge status: Home / Self Care | Payer: BC Managed Care – PPO | Source: Ambulatory Visit | Attending: Obstetrics and Gynecology | Admitting: Obstetrics and Gynecology

## 2021-04-18 ENCOUNTER — Other Ambulatory Visit: Payer: Self-pay

## 2021-04-18 DIAGNOSIS — Z1231 Encounter for screening mammogram for malignant neoplasm of breast: Secondary | ICD-10-CM

## 2021-08-12 ENCOUNTER — Ambulatory Visit: Payer: BC Managed Care – PPO | Admitting: Family Medicine

## 2021-08-12 VITALS — BP 146/90 | HR 85 | Temp 97.8°F

## 2021-08-12 DIAGNOSIS — L080 Pyoderma: Secondary | ICD-10-CM

## 2021-08-12 MED ORDER — CEPHALEXIN 500 MG PO CAPS: 500.0000 mg | ORAL_CAPSULE | Freq: Three times a day (TID) | ORAL | 0 refills | Status: DC

## 2021-08-12 MED ORDER — MUPIROCIN 2 % EX OINT: 1.0000 "application " | TOPICAL_OINTMENT | Freq: Two times a day (BID) | CUTANEOUS | 0 refills | Status: DC

## 2021-08-12 NOTE — Patient Instructions (Signed)
Keep area clean with soap and water  Go ahead and start the oral antibiotic and topical .   Let me know if not clearing over next few days.

## 2021-08-12 NOTE — Progress Notes (Signed)
Established Patient Office Visit  Subjective:  Patient ID: Denise Munoz, female    DOB: [birth year]  Age: 66 y.o. MRN: [id number]  CC: No chief complaint on file.   HPI Denise Munoz presents for skin rash involving her lower lip and spreading down toward the chin past few days.  She first noticed this Friday when she was at a conference.  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  No recent fevers or chills.  No known history of MRSA.  She has had previous intolerance with doxycycline.  She has noticed some small erythematous papules down her chin region past couple days.  No known history of perioral dermatitis.  Past Medical History:  Diagnosis Date   Abnormal Pap smear of cervix ~1986   probable cryotherapy to cervix per patient   Arthritis    saw Dr. Charlestine Night for polyarthralgia and chronic fatigue, ? fibromyalgia   Diverticulitis 09/25/15   Noted on CT scan   Fibromyalgia    Headache    history of migraines   History of mumps as a child    Hyperlipidemia 08/16/15   IBS (irritable bowel syndrome)    Leukocytosis    seen by rheumatology and oncology in the past and per pt felt weight related   Low serum vitamin D 08/16/15   Scarlet fever     Past Surgical History:  Procedure Laterality Date   ABDOMINAL HYSTERECTOMY N/A 07/17/2014   Procedure: for fibroids, HYSTERECTOMY ABDOMINAL;  Surgeon: Jamey Reas de Berton Lan, MD;  Location: [facility];  Service: Gynecology;  Laterality: N/A;   COLPOSCOPY  ~1986   Normal   SALPINGOOPHORECTOMY Bilateral 07/17/2014   Procedure: SALPINGO OOPHORECTOMY with collection of pelvic washings;  Surgeon: Jamey Reas de Berton Lan, MD;  Location: [facility];  Service: Gynecology;  Laterality: Bilateral;   TONSILLECTOMY      Family History  Problem Relation Age of Onset   Breast cancer Mother 14   Alzheimer's disease Father 28   Bladder Cancer Brother 21   Breast cancer Maternal  Grandmother 46   Healthy Son    Colon cancer Neg Hx     Social History   Socioeconomic History   Marital status: Divorced    Spouse name: Not on file   Number of children: Not on file   Years of education: Not on file   Highest education level: Not on file  Occupational History   Not on file  Tobacco Use   Smoking status: Former    Packs/day: 0.50    Years: 36.00    Pack years: 18.00    Types: Cigarettes    Quit date: 05/17/2010    Years since quitting: 11.2   Smokeless tobacco: Never  Vaping Use   Vaping Use: Never used  Substance and Sexual Activity   Alcohol use: No    Alcohol/week: 0.0 standard drinks   Drug use: No   Sexual activity: Not Currently    Birth control/protection: Post-menopausal, Surgical    Comment: TAH/BSO 09-05-14  Other Topics Concern   Not on file  Social History Narrative   Work or School: Investment banker, corporate      Home Situation:      Spiritual Beliefs:      Lifestyle: admits likes sugar, soda, lots of dairy; no regular exercise      Social Determinants of Health   Financial Resource Strain: Not on file  Food Insecurity: Not  on file  Transportation Needs: Not on file  Physical Activity: Not on file  Stress: Not on file  Social Connections: Not on file  Intimate Partner Violence: Not on file    Outpatient Medications Prior to Visit  Medication Sig Dispense Refill   Cholecalciferol (VITAMIN D) 125 MCG (5000 UT) CAPS      Cyanocobalamin (VITAMIN B 12) 100 MCG LOZG      OVER THE COUNTER MEDICATION AREDS Preservision     No facility-administered medications prior to visit.    Allergies  Allergen Reactions   Cymbalta [Duloxetine Hcl]     Tremors, fatigue, lack of motivation   Lexapro [Escitalopram]     Fatigue, lack of motivation   Doxycycline Nausea Only    diarrhea     ROS Review of Systems  Constitutional:  Negative for chills and fever.  Skin:  Positive for rash.     Objective:    Physical Exam Vitals reviewed.   Constitutional:      Appearance: Normal appearance.  Cardiovascular:     Rate and Rhythm: Normal rate and regular rhythm.  Skin:    Findings: Rash present.     Comments: She has somewhat of a crusted type rash involving her lower lip which crosses midline.  She has a few small nonspecific erythematous papules down the chin region and a couple small pustules.  Neurological:     Mental Status: She is alert.    BP (!) 146/90 (BP Location: Left Arm, Patient Position: Sitting, Cuff Size: Normal)    Pulse 85    Temp 97.8 F (36.6 C) (Oral)    LMP 05/13/2006    SpO2 97%  Wt Readings from Last 3 Encounters:  03/03/21 212 lb 3.2 oz (96.3 kg)  07/16/20 212 lb 12.8 oz (96.5 kg)  08/29/18 212 lb 9.6 oz (96.4 kg)     Health Maintenance Due  Topic Date Due   Pneumonia Vaccine 21+ Years old (1 - PCV) Never done   DEXA SCAN  Never done   COVID-19 Vaccine (4 - Booster for Pfizer series) 08/29/2020   TETANUS/TDAP  06/12/2021    There are no preventive care reminders to display for this patient.  Lab Results  Component Value Date   TSH 2.33 05/24/2020   Lab Results  Component Value Date   WBC 12.7 (H) 05/24/2020   HGB 15.5 05/24/2020   HCT 46.5 (H) 05/24/2020   MCV 86.1 05/24/2020   PLT 288 05/24/2020   Lab Results  Component Value Date   NA 138 05/24/2020   K 3.6 05/24/2020   CO2 25 05/24/2020   GLUCOSE 71 05/24/2020   BUN 14 05/24/2020   CREATININE 0.54 05/24/2020   BILITOT 0.5 05/24/2020   ALKPHOS 115 04/22/2017   AST 36 (H) 05/24/2020   ALT 35 (H) 05/24/2020   PROT 7.6 05/24/2020   ALBUMIN 4.0 04/22/2017   CALCIUM 9.2 05/24/2020   ANIONGAP 5 07/18/2014   GFR 101.44 04/22/2017   Lab Results  Component Value Date   CHOL 210 (H) 05/24/2020   Lab Results  Component Value Date   HDL 44 (L) 05/24/2020   Lab Results  Component Value Date   LDLCALC 126 (H) 05/24/2020   Lab Results  Component Value Date   TRIG 262 (H) 05/24/2020   Lab Results  Component Value  Date   CHOLHDL 4.8 05/24/2020   Lab Results  Component Value Date   HGBA1C 5.4 05/24/2020      Assessment & Plan:  Patient seen with approximately 4-day history of skin rash on her lower lip and chin region.  She initially thought these were cold sores but this almost looks more impetigo like.  She has history of allergy to doxycycline.  No known history of MRSA.  -We recommended keeping clean with good antibacterial soap -Bactroban ointment apply twice daily -Keflex 500 mg 3 times daily for 7 days.  If not clearing probably in the next few days consider changing to Septra DS for MRSA coverage.    Follow-up: No follow-ups on file.    Carolann Littler, MD

## 2021-10-10 ENCOUNTER — Encounter (INDEPENDENT_AMBULATORY_CARE_PROVIDER_SITE_OTHER): Payer: BC Managed Care – PPO | Admitting: Ophthalmology

## 2021-10-10 DIAGNOSIS — H353132 Nonexudative age-related macular degeneration, bilateral, intermediate dry stage: Secondary | ICD-10-CM | POA: Diagnosis not present

## 2021-10-10 DIAGNOSIS — D3132 Benign neoplasm of left choroid: Secondary | ICD-10-CM

## 2021-10-10 DIAGNOSIS — D3131 Benign neoplasm of right choroid: Secondary | ICD-10-CM | POA: Diagnosis not present

## 2021-10-10 DIAGNOSIS — H43813 Vitreous degeneration, bilateral: Secondary | ICD-10-CM

## 2021-10-10 DIAGNOSIS — H2513 Age-related nuclear cataract, bilateral: Secondary | ICD-10-CM

## 2021-12-31 ENCOUNTER — Ambulatory Visit: Payer: BC Managed Care – PPO | Admitting: Family

## 2021-12-31 ENCOUNTER — Encounter: Payer: Self-pay | Admitting: Family

## 2021-12-31 VITALS — BP 106/60 | HR 75 | Temp 98.3°F | Wt 215.3 lb

## 2021-12-31 DIAGNOSIS — R42 Dizziness and giddiness: Secondary | ICD-10-CM

## 2021-12-31 DIAGNOSIS — E559 Vitamin D deficiency, unspecified: Secondary | ICD-10-CM | POA: Diagnosis not present

## 2021-12-31 DIAGNOSIS — H6123 Impacted cerumen, bilateral: Secondary | ICD-10-CM | POA: Diagnosis not present

## 2021-12-31 DIAGNOSIS — R251 Tremor, unspecified: Secondary | ICD-10-CM | POA: Diagnosis not present

## 2021-12-31 LAB — CBC WITH DIFFERENTIAL/PLATELET
Basophils Absolute: 0.1 10*3/uL (ref 0.0–0.1)
Basophils Relative: 0.6 % (ref 0.0–3.0)
Eosinophils Absolute: 0.1 10*3/uL (ref 0.0–0.7)
Eosinophils Relative: 1.2 % (ref 0.0–5.0)
HCT: 43 % (ref 36.0–46.0)
Hemoglobin: 14.8 g/dL (ref 12.0–15.0)
Lymphocytes Relative: 18.6 % (ref 12.0–46.0)
Lymphs Abs: 2.3 10*3/uL (ref 0.7–4.0)
MCHC: 34.4 g/dL (ref 30.0–36.0)
MCV: 87.5 fl (ref 78.0–100.0)
Monocytes Absolute: 1.1 10*3/uL — ABNORMAL HIGH (ref 0.1–1.0)
Monocytes Relative: 8.6 % (ref 3.0–12.0)
Neutro Abs: 9 10*3/uL — ABNORMAL HIGH (ref 1.4–7.7)
Neutrophils Relative %: 71 % (ref 43.0–77.0)
Platelets: 244 10*3/uL (ref 150.0–400.0)
RBC: 4.92 Mil/uL (ref 3.87–5.11)
RDW: 12.9 % (ref 11.5–15.5)
WBC: 12.6 10*3/uL — ABNORMAL HIGH (ref 4.0–10.5)

## 2021-12-31 LAB — COMPREHENSIVE METABOLIC PANEL
ALT: 45 U/L — ABNORMAL HIGH (ref 0–35)
AST: 43 U/L — ABNORMAL HIGH (ref 0–37)
Albumin: 3.9 g/dL (ref 3.5–5.2)
Alkaline Phosphatase: 114 U/L (ref 39–117)
BUN: 15 mg/dL (ref 6–23)
CO2: 27 mEq/L (ref 19–32)
Calcium: 9.1 mg/dL (ref 8.4–10.5)
Chloride: 104 mEq/L (ref 96–112)
Creatinine, Ser: 0.68 mg/dL (ref 0.40–1.20)
GFR: 89.95 mL/min (ref >60.00)
Glucose, Bld: 108 mg/dL — ABNORMAL HIGH (ref 70–99)
Potassium: 3.5 mEq/L (ref 3.5–5.1)
Sodium: 138 mEq/L (ref 135–145)
Total Bilirubin: 0.8 mg/dL (ref 0.2–1.2)
Total Protein: 7.6 g/dL (ref 6.0–8.3)

## 2021-12-31 LAB — TSH: TSH: 2.3 u[IU]/mL (ref 0.35–5.50)

## 2021-12-31 LAB — VITAMIN D 25 HYDROXY (VIT D DEFICIENCY, FRACTURES): VITD: 27.02 ng/mL — ABNORMAL LOW (ref 30.00–100.00)

## 2021-12-31 NOTE — Progress Notes (Signed)
Acute Office Visit  Subjective:     Patient ID: Denise Munoz, female    DOB: [birth year], 66 y.o.   MRN: [id number]  Chief Complaint  Patient presents with   Dizziness    HPI Patient is in today with concerns of dizziness, lightheadedness, headaches, and shakiness x 6 months. She reports having a constant state of dizziness that is not impacted by position. She reports stress at work, working 60+ hours per week but overall loves her job. She is not working out like she once was because she lacks time. Also reports an episode of "passing out" out in her yard. Unsure if she lost consciousness but reports that she waved at her neighbor then fell down. She hadn't eaten at the time so she ate and felt better. Has not had this occur again.   Review of Systems  Constitutional:  Negative for malaise/fatigue.  Respiratory:  Negative for shortness of breath.   Cardiovascular:  Negative for chest pain.  Gastrointestinal: Negative.   Musculoskeletal: Negative.   Neurological:  Positive for dizziness and headaches.  Psychiatric/Behavioral:  Negative for depression. The patient is nervous/anxious.   All other systems reviewed and are negative.       Objective:    BP 106/60 (BP Location: Left Arm, Cuff Size: Large)   Pulse 75   Temp 98.3 F (36.8 C) (Oral)   Wt 215 lb 4.8 oz (97.7 kg)   LMP 05/13/2006   SpO2 96%   BMI 39.70 kg/m    Physical Exam Vitals reviewed.  Constitutional:      Appearance: She is obese.  HENT:     Head: Normocephalic.     Right Ear: There is impacted cerumen.     Left Ear: There is impacted cerumen.     Ears:     Comments:  Informed consent was obtained and peroxide gel was inserted into the ears bilaterally using the lavage kit the ears were lavaged until clean.Inspection with a cerumen spoon removed residual wax. Patient tolerated the procedure well.     Mouth/Throat:     Mouth: Mucous membranes are moist.  Eyes:     Extraocular Movements:  Extraocular movements intact.     Conjunctiva/sclera: Conjunctivae normal.     Pupils: Pupils are equal, round, and reactive to light.  Cardiovascular:     Rate and Rhythm: Normal rate and regular rhythm.     Comments: Recheck BP 120/80 Pulmonary:     Effort: Pulmonary effort is normal.     Breath sounds: Normal breath sounds.  Abdominal:     General: Abdomen is flat.     Palpations: Abdomen is soft.  Musculoskeletal:        General: Normal range of motion.     Cervical back: Normal range of motion.  Skin:    General: Skin is warm and dry.  Neurological:     General: No focal deficit present.     Mental Status: She is alert and oriented to person, place, and time.  Psychiatric:        Mood and Affect: Mood normal.        Behavior: Behavior normal.     No results found for any visits on 12/31/21.      Assessment & Plan:   Problem List Items Addressed This Visit   None Visit Diagnoses     Dizziness    -  Primary   Relevant Orders   TSH   CBC with Differential/Platelets   CMP  POC Urinalysis Dipstick   Shakiness       Relevant Orders   TSH   CBC with Differential/Platelets   CMP   POC Urinalysis Dipstick   Bilateral impacted cerumen       Vitamin D deficiency       Relevant Orders   CBC with Differential/Platelets   CMP   Vitamin D, 25-hydroxy      Will follow-up with patient pending lab results. Call the office if symptoms worsen or persist. Recheck as scheduled and sooner as needed.    No follow-ups on file.  Kennyth Arnold, FNP

## 2022-01-01 ENCOUNTER — Other Ambulatory Visit: Payer: Self-pay | Admitting: Family

## 2022-01-01 ENCOUNTER — Other Ambulatory Visit: Payer: Self-pay | Admitting: *Deleted

## 2022-01-01 DIAGNOSIS — E559 Vitamin D deficiency, unspecified: Secondary | ICD-10-CM

## 2022-01-01 DIAGNOSIS — R899 Unspecified abnormal finding in specimens from other organs, systems and tissues: Secondary | ICD-10-CM

## 2022-01-01 MED ORDER — VITAMIN D (ERGOCALCIFEROL) 1.25 MG (50000 UNIT) PO CAPS: 50000.0000 [IU] | ORAL_CAPSULE | ORAL | 0 refills | Status: DC

## 2022-03-14 ENCOUNTER — Other Ambulatory Visit: Payer: Self-pay

## 2022-03-14 ENCOUNTER — Emergency Department (HOSPITAL_BASED_OUTPATIENT_CLINIC_OR_DEPARTMENT_OTHER)
Admission: EM | Admit: 2022-03-14 | Discharge: 2022-03-14 | Disposition: A | Discharge status: Home / Self Care | Payer: BC Managed Care – PPO | Attending: Emergency Medicine | Admitting: Emergency Medicine

## 2022-03-14 ENCOUNTER — Encounter (HOSPITAL_BASED_OUTPATIENT_CLINIC_OR_DEPARTMENT_OTHER): Payer: Self-pay | Admitting: Emergency Medicine

## 2022-03-14 DIAGNOSIS — R040 Epistaxis: Secondary | ICD-10-CM | POA: Insufficient documentation

## 2022-03-14 DIAGNOSIS — R42 Dizziness and giddiness: Secondary | ICD-10-CM | POA: Diagnosis not present

## 2022-03-14 DIAGNOSIS — R55 Syncope and collapse: Secondary | ICD-10-CM

## 2022-03-14 LAB — CBC WITH DIFFERENTIAL/PLATELET
Abs Immature Granulocytes: 0.14 10*3/uL — ABNORMAL HIGH (ref 0.00–0.07)
Basophils Absolute: 0.1 10*3/uL (ref 0.0–0.1)
Basophils Relative: 1 %
Eosinophils Absolute: 0.1 10*3/uL (ref 0.0–0.5)
Eosinophils Relative: 1 %
HCT: 37.7 % (ref 36.0–46.0)
Hemoglobin: 13.2 g/dL (ref 12.0–15.0)
Immature Granulocytes: 1 %
Lymphocytes Relative: 14 %
Lymphs Abs: 2.5 10*3/uL (ref 0.7–4.0)
MCH: 29.9 pg (ref 26.0–34.0)
MCHC: 35 g/dL (ref 30.0–36.0)
MCV: 85.3 fL (ref 80.0–100.0)
Monocytes Absolute: 1.4 10*3/uL — ABNORMAL HIGH (ref 0.1–1.0)
Monocytes Relative: 8 %
Neutro Abs: 14.2 10*3/uL — ABNORMAL HIGH (ref 1.7–7.7)
Neutrophils Relative %: 75 %
Platelets: 299 10*3/uL (ref 150–400)
RBC: 4.42 MIL/uL (ref 3.87–5.11)
RDW: 12.4 % (ref 11.5–15.5)
WBC: 18.5 10*3/uL — ABNORMAL HIGH (ref 4.0–10.5)
nRBC: 0 % (ref 0.0–0.2)

## 2022-03-14 LAB — BASIC METABOLIC PANEL
Anion gap: 10 (ref 5–15)
BUN: 22 mg/dL (ref 8–23)
CO2: 23 mmol/L (ref 22–32)
Calcium: 9.4 mg/dL (ref 8.9–10.3)
Chloride: 104 mmol/L (ref 98–111)
Creatinine, Ser: 0.96 mg/dL (ref 0.44–1.00)
GFR, Estimated: >60 mL/min (ref >60)
Glucose, Bld: 190 mg/dL — ABNORMAL HIGH (ref 70–99)
Potassium: 3.6 mmol/L (ref 3.5–5.1)
Sodium: 137 mmol/L (ref 135–145)

## 2022-03-14 MED ORDER — AMOXICILLIN-POT CLAVULANATE 875-125 MG PO TABS
1.0000 | ORAL_TABLET | Freq: Two times a day (BID) | ORAL | 0 refills | Status: DC
Start: 2022-03-14 — End: 2022-04-14

## 2022-03-14 MED ORDER — AMOXICILLIN-POT CLAVULANATE 875-125 MG PO TABS
1.0000 | ORAL_TABLET | Freq: Once | ORAL | Status: AC
  Administered 2022-03-14: 1 via ORAL
  Filled 2022-03-14: qty 1

## 2022-03-14 MED ORDER — ACETAMINOPHEN 500 MG PO TABS
1000.0000 mg | ORAL_TABLET | Freq: Once | ORAL | Status: DC
  Filled 2022-03-14: qty 2

## 2022-03-14 MED ORDER — OXYMETAZOLINE HCL 0.05 % NA SOLN
1.0000 | Freq: Once | NASAL | Status: AC
  Administered 2022-03-14: 1 via NASAL
  Filled 2022-03-14: qty 30

## 2022-03-14 NOTE — Discharge Instructions (Addendum)
Take the antibiotic as discussed.  Call on Tuesday morning to either follow-up with an ear nose and throat doctor that is listed above or your primary care doctor.  You should have the nasal packing removed by Tuesday.  If you are unable to get into these providers, you can come back to the emergency room for removal.  Return to the emergency room if you have any worsening symptoms or return of the bleeding.

## 2022-03-14 NOTE — ED Notes (Addendum)
Dr. Tamera Punt to bedside. Afrin given and nose clamps applied

## 2022-03-14 NOTE — ED Notes (Signed)
Notified by Dr. Tamera Punt of patient having a syncopal episode while placing nasal rocket (became clammy, lightheaded).   Patient being assisted by Dr. Tamera Punt and another ED nurse upon arrival to room.   IV placed at this time and Dr. Tamera Punt and gave verbal or for 1 liter of normal saline. IV fluids started.   Patient returned back to baseline orientation during this time. Placed on cardiac monitor, EKG obtained. Dr. Tamera Punt remains at bedside.

## 2022-03-14 NOTE — ED Triage Notes (Signed)
Nose bleed, uncontrolled. Started 3pm. Also had nosebleed at 3am in morning that stopped after 20 minuted. Denies blood thinner or recent Nsaid use.  Now complains of feeling dizzy.

## 2022-03-14 NOTE — ED Notes (Signed)
Pt PO challenged. Pt given ginger ale and crackers. Pt tolerating well.

## 2022-03-14 NOTE — ED Provider Notes (Signed)
[facility] DEPT Provider Note   CSN: [id number] Arrival date & time: 03/14/22  1738     History  Chief Complaint  Patient presents with   Epistaxis    Denise Munoz is a 67 y.o. female.  Patient is a 66 year old female who presents with a nosebleed.  She states that she had a nosebleed during the night about 3 AM that resolved with pressure.  It started again about 3 PM today and she has not been able to get it stopped.  She had a head cold about a week ago but says that had improved.  She is not on anticoagulants.  No history of recurrent nosebleeds.  No history of recent trauma to the nose.  She does have a bit of lightheadedness.       Home Medications Prior to Admission medications   Medication Sig Start Date End Date Taking? Authorizing Provider  amoxicillin-clavulanate (AUGMENTIN) 875-125 MG tablet Take 1 tablet by mouth every 12 (twelve) hours. 03/14/22  Yes Malvin Johns, MD  Vitamin D, Ergocalciferol, (DRISDOL) 1.25 MG (50000 UNIT) CAPS capsule Take 1 capsule (50,000 Units total) by mouth every 7 (seven) days. 01/01/22   Kennyth Arnold, FNP      Allergies    Cymbalta [duloxetine hcl], Lexapro [escitalopram], and Doxycycline    Review of Systems   Review of Systems  Constitutional:  Negative for chills, diaphoresis, fatigue and fever.  HENT:  Positive for nosebleeds. Negative for congestion, rhinorrhea and sneezing.   Eyes: Negative.   Respiratory:  Negative for cough, chest tightness and shortness of breath.   Cardiovascular:  Negative for chest pain and leg swelling.  Gastrointestinal:  Negative for abdominal pain, blood in stool, diarrhea, nausea and vomiting.  Genitourinary:  Negative for difficulty urinating, flank pain, frequency and hematuria.  Musculoskeletal:  Negative for arthralgias and back pain.  Skin:  Negative for rash.  Neurological:  Positive for light-headedness. Negative for dizziness, speech difficulty, weakness,  numbness and headaches.    Physical Exam Updated Vital Signs BP 100/80   Pulse 89   Temp (!) 96.1 F (35.6 C) (Temporal)   Resp 18   LMP 05/13/2006   SpO2 99%  Physical Exam Constitutional:      Appearance: She is well-developed.  HENT:     Head: Normocephalic and atraumatic.     Nose:     Comments: Bleeding from the left nare Eyes:     Pupils: Pupils are equal, round, and reactive to light.  Cardiovascular:     Rate and Rhythm: Normal rate and regular rhythm.     Heart sounds: Normal heart sounds.  Pulmonary:     Effort: Pulmonary effort is normal. No respiratory distress.     Breath sounds: Normal breath sounds. No wheezing or rales.  Chest:     Chest wall: No tenderness.  Abdominal:     General: Bowel sounds are normal.     Palpations: Abdomen is soft.     Tenderness: There is no abdominal tenderness. There is no guarding or rebound.  Musculoskeletal:        General: Normal range of motion.     Cervical back: Normal range of motion and neck supple.  Lymphadenopathy:     Cervical: No cervical adenopathy.  Skin:    General: Skin is warm and dry.     Findings: No rash.  Neurological:     Mental Status: She is alert and oriented to person, place, and time.  ED Results / Procedures / Treatments   Labs (all labs ordered are listed, but only abnormal results are displayed) Labs Reviewed  CBC WITH DIFFERENTIAL/PLATELET - Abnormal; Notable for the following components:      Result Value   WBC 18.5 (*)    Neutro Abs 14.2 (*)    Monocytes Absolute 1.4 (*)    Abs Immature Granulocytes 0.14 (*)    All other components within normal limits  BASIC METABOLIC PANEL - Abnormal; Notable for the following components:   Glucose, Bld 190 (*)    All other components within normal limits    EKG EKG Interpretation  Date/Time:  Saturday March 14 2022 19:03:07 EDT Ventricular Rate:  77 PR Interval:  129 QRS Duration: 139 QT Interval:  374 QTC Calculation: 424 R  Axis:   57 Text Interpretation: Sinus rhythm Right bundle branch block Confirmed by Malvin Johns [phone]) on 03/14/2022 7:30:15 PM  Radiology No results found.  Procedures .Epistaxis Management  Date/Time: 03/14/2022 9:21 PM  Performed by: Malvin Johns, MD Authorized by: Malvin Johns, MD   Consent:    Consent obtained:  Verbal   Consent given by:  Patient   Risks, benefits, and alternatives were discussed: yes   Procedure details:    Treatment site:  L anterior   Treatment method:  Nasal tampon   Treatment complexity:  Limited   Treatment episode: initial   Post-procedure details:    Assessment:  Bleeding stopped   Procedure completion:  Tolerated with difficulty     Medications Ordered in ED Medications  acetaminophen (TYLENOL) tablet 1,000 mg (1,000 mg Oral Patient Refused/Not Given 03/14/22 2001)  amoxicillin-clavulanate (AUGMENTIN) 875-125 MG per tablet 1 tablet (has no administration in time range)  oxymetazoline (AFRIN) 0.05 % nasal spray 1 spray (1 spray Each Nare Given 03/14/22 1757)    ED Course/ Medical Decision Making/ A&P                           Medical Decision Making Amount and/or Complexity of Data Reviewed Labs: ordered.  Risk OTC drugs. Prescription drug management.   Patient is a 66 year old who presents with a nosebleed.  Initially we tried some Afrin and nasal clip.  This did not stop the bleeding.  Rhino Rocket was placed.  This actually did stop the bleeding however during the episode of the placement of the [facility], patient had a vasovagal type event.  She became nauseated and had an episode of hypotension and diaphoresis.  She was laid down and given some IV fluids.  She had rapid improvement after this event.  Labs were obtained.  She has a normal hemoglobin.  Her white count is elevated.  She has had some elevated white counts in the past.  It is a bit more elevated today which is likely a stress reaction.  She does not have any suggestions  of infection.  Her electrolytes are nonconcerning.  She was monitored for couple more hours and had ongoing improvement.  No further bleeding was noted from the nose.  No blood in the posterior pharynx.  She was able to eat and drink without difficulty.  She was feeling much better.  EKG did not show any arrhythmias or ischemic changes.  She has a right bundle branch block but the last EKG that we have on file is from several years ago.  I feel that it is unrelated to the event that she had today.  We will  plan on leaving the [employer] in.  She was started on Augmentin to take while she has the nasal packing in.  She was advised that she will need to have it removed on Tuesday.  She was given a referral to an ENT.  She will either need to follow-up with the ENT or primary care doctor on Tuesday to have it removed.  If she cannot see 1 of these physicians, she can come back to the ED to have it removed.  Return precautions were given.  Final Clinical Impression(s) / ED Diagnoses Final diagnoses:  Epistaxis  Vasovagal episode    Rx / DC Orders ED Discharge Orders          Ordered    amoxicillin-clavulanate (AUGMENTIN) 875-125 MG tablet  Every 12 hours        03/14/22 2116              Malvin Johns, MD 03/14/22 2122

## 2022-03-17 ENCOUNTER — Ambulatory Visit (HOSPITAL_BASED_OUTPATIENT_CLINIC_OR_DEPARTMENT_OTHER): Payer: BC Managed Care – PPO | Admitting: Nurse Practitioner

## 2022-03-17 ENCOUNTER — Encounter: Payer: Self-pay | Admitting: Family Medicine

## 2022-03-17 ENCOUNTER — Ambulatory Visit (INDEPENDENT_AMBULATORY_CARE_PROVIDER_SITE_OTHER): Payer: BC Managed Care – PPO | Admitting: Family Medicine

## 2022-03-17 DIAGNOSIS — R040 Epistaxis: Secondary | ICD-10-CM

## 2022-03-17 NOTE — Progress Notes (Signed)
   Established Patient Office Visit  Subjective   Patient ID: Denise Munoz, female    DOB: 09-16-1955  Age: 66 y.o. MRN: [id number]  Chief Complaint  Patient presents with   Follow-up    Patient presents for ER follow up and nasal packing change    Pt is here for follow up from the ER for persistent nosebleed. She had a nasal rocket placed and she reported no further bleeding. States it is painful for her and she would like to have it removed today. She denies any fever/chills.      ROS    Objective:     BP (!) 104/58 (BP Location: Right Arm, Patient Position: Sitting, Cuff Size: Large)   Pulse 90   Temp 97.8 F (36.6 C) (Axillary)   LMP 05/13/2006   SpO2 97%    Physical Exam HENT:     Nose: No nasal deformity.     Comments: Left nostril nasal rocket was removed in office today, turbinates appear irritated but there are no visible polyps or abrasions in the nose, no evidence of re-bleed after removal.     No results found for any visits on 03/17/22.    The 10-year ASCVD risk score (Arnett DK, et al., 2019) is: 5.1%    Assessment & Plan:   Problem List Items Addressed This Visit       Other   Left-sided nosebleed    S/p nasal rocket removal. No evidence of re-bleed after removal. Patient will continue to monitor for signs of re-bleed at home and contact us if bleeding occurs.       No follow-ups on file.    [name], MD

## 2022-03-19 NOTE — Assessment & Plan Note (Addendum)
S/p nasal rocket removal. No evidence of re-bleed after removal. Patient will continue to monitor for signs of re-bleed at home and contact us if bleeding occurs.

## 2022-04-07 ENCOUNTER — Encounter: Payer: BC Managed Care – PPO | Admitting: Family Medicine

## 2022-04-14 ENCOUNTER — Ambulatory Visit: Payer: BC Managed Care – PPO | Admitting: Family Medicine

## 2022-04-14 ENCOUNTER — Encounter: Payer: Self-pay | Admitting: Family Medicine

## 2022-04-14 VITALS — BP 118/88 | HR 92 | Temp 98.0°F | Ht 61.75 in | Wt 219.1 lb

## 2022-04-14 DIAGNOSIS — R739 Hyperglycemia, unspecified: Secondary | ICD-10-CM | POA: Diagnosis not present

## 2022-04-14 DIAGNOSIS — K7581 Nonalcoholic steatohepatitis (NASH): Secondary | ICD-10-CM | POA: Diagnosis not present

## 2022-04-14 DIAGNOSIS — E782 Mixed hyperlipidemia: Secondary | ICD-10-CM

## 2022-04-14 DIAGNOSIS — M797 Fibromyalgia: Secondary | ICD-10-CM

## 2022-04-14 DIAGNOSIS — R5382 Chronic fatigue, unspecified: Secondary | ICD-10-CM

## 2022-04-14 DIAGNOSIS — D72828 Other elevated white blood cell count: Secondary | ICD-10-CM

## 2022-04-14 LAB — POCT GLYCOSYLATED HEMOGLOBIN (HGB A1C): Hemoglobin A1C: 5.2 % (ref 4.0–5.6)

## 2022-04-14 MED ORDER — PREGABALIN 25 MG PO CAPS: 25.0000 mg | ORAL_CAPSULE | Freq: Two times a day (BID) | ORAL | 1 refills | Status: DC

## 2022-04-14 NOTE — Progress Notes (Unsigned)
Established Patient Office Visit  Subjective   Patient ID: Denise Munoz, female    DOB: [birth year]  Age: 66 y.o. MRN: [id number]  Chief Complaint  Patient presents with   Establish Care    Patient is here for transition of care visit.   Patient reports she has a history of NASH in the past. She states that she is occasionally feels lightheaded/dizzy, and is wondering if her blood sugar is dropping, or maybe going up too high. She reports that she is going to the gym and exercising however she is having trouble losing weight. She states that she is watching her diet, but she does love regular pepsi. She reports she has been to several specialists including the rheumatology for her chronic joint pain and hematology for the elevated WBC.   Had some abnormal labs a couple years ago including positive ANA and elevated CRP with elevated WBC. States that she used to be much more physically active but her ability to exercise has decreased due to the chronic fatigue and chronic pain. I have extensively reviewed her previous labs, and I have reviewed the note from the rheumatologist. She reports she has not had a work up for the NASH as of yet.  She was recently seen in the ER due to a nose bleed and I reviewed her labs from that time.   Patient has a history of IBS symptoms and also hemorrhoids. Patient usually takes PRN imodium as needed for her symptoms. 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 Negative for chills and fever.  HENT:  Negative for hearing loss.   Eyes:  Negative for blurred vision.  Respiratory:  Negative for shortness of breath.   Cardiovascular:  Negative for chest pain and leg swelling.  Musculoskeletal:  Positive for joint pain and myalgias.  Neurological:  Negative for dizziness, weakness and headaches.  Endo/Heme/Allergies:  Bruises/bleeds easily.  All other systems reviewed and are negative.     Objective:     BP 118/88 (BP Location: Left Arm, Patient Position: Sitting, Cuff Size: Large)   Pulse 92   Temp 98 F (36.7 C) (Oral)   Ht 5' 1.75" (1.568 m)   Wt 219 lb 1.6 oz (99.4 kg)   LMP 05/13/2006   SpO2 98%   BMI 40.40 kg/m    Physical Exam Vitals reviewed.  Constitutional:      Appearance: Normal appearance. She is well-groomed. She is obese.  HENT:     Head: Normocephalic and atraumatic.  Eyes:     Conjunctiva/sclera:  Conjunctivae normal.  Neck:     Thyroid: No thyromegaly.  Cardiovascular:     Rate and Rhythm: Normal rate and regular rhythm.     Pulses: Normal pulses.     Heart sounds: S1 normal and S2 normal.  Pulmonary:     Effort: Pulmonary effort is normal.     Breath sounds: Normal breath sounds and air entry.  Abdominal:     General: Bowel sounds are normal.  Musculoskeletal:        General: Normal range of motion.     Cervical back: Normal range of motion.     Right lower leg: No edema.     Left lower leg: No edema.  Skin:    General: Skin is warm and dry.  Neurological:     Mental Status: She is  alert and oriented to person, place, and time. Mental status is at baseline.     Gait: Gait is intact.  Psychiatric:        Mood and Affect: Mood and affect normal.        Speech: Speech normal.        Behavior: Behavior normal.        Judgment: Judgment normal.      Results for orders placed or performed in visit on 04/14/22  POC HgB A1c  Result Value Ref Range   Hemoglobin A1C 5.2 4.0 - 5.6 %   HbA1c POC (<> result, manual entry)     HbA1c, POC (prediabetic range)     HbA1c, POC (controlled diabetic range)        The 10-year ASCVD risk score (Arnett DK, et al., 2019) is: 6.2%    Assessment & Plan:   Problem List Items Addressed This Visit       Digestive   NASH (nonalcoholic steatohepatitis)    Reviewed labs and a CT abd from 2017, She needs a work up to rule out autoimmune hepatitis and primary biliary cirrhosis, I have ordered LDH, repeat ANA, CRP, and other autoantibody testing. I had an extensive (45 minute) discussion about her overall picture and I advised referral to GI for the NASH.       Relevant Orders   Lactate Dehydrogenase   ANA   C-reactive Protein (Completed)   Anti-smooth Muscle Antibody, IgG   Anti-DNA antibody, double-stranded   Ambulatory referral to Gastroenterology     Other   Leukocytosis    Has been evaluated by hematology in the past, WBC last month was 18, will reorder CBC today for surveillance.      Relevant Orders   CBC with Differential/Platelets (Completed)   Chronic fatigue    Pt has dx of fibromyalgia currently, has seen rheumatology in the past, will recheck TSH, see above discussion      Relevant Orders   TSH (Completed)   Hyperlipemia    Needs new lipid panel      Relevant Orders   Lipid Panel (Completed)   Fibromyalgia    We discussed medications we could try to help with her chronic pain, I recommended starting low dose lyrica and she will follow up with me in 3 months.      Relevant Medications   pregabalin  (LYRICA) 25 MG capsule   Other Visit Diagnoses     Hyperglycemia    -  Primary   Relevant Orders   POC HgB A1c (Completed)       Return in about 3 months (around 07/15/2022) for follow up lyrica.  [name], MD

## 2022-04-15 ENCOUNTER — Encounter: Payer: Self-pay | Admitting: Internal Medicine

## 2022-04-15 ENCOUNTER — Other Ambulatory Visit (INDEPENDENT_AMBULATORY_CARE_PROVIDER_SITE_OTHER): Payer: BC Managed Care – PPO

## 2022-04-15 DIAGNOSIS — D72828 Other elevated white blood cell count: Secondary | ICD-10-CM | POA: Diagnosis not present

## 2022-04-15 DIAGNOSIS — R5382 Chronic fatigue, unspecified: Secondary | ICD-10-CM | POA: Diagnosis not present

## 2022-04-15 DIAGNOSIS — E782 Mixed hyperlipidemia: Secondary | ICD-10-CM

## 2022-04-15 DIAGNOSIS — K7581 Nonalcoholic steatohepatitis (NASH): Secondary | ICD-10-CM | POA: Diagnosis not present

## 2022-04-15 LAB — CBC WITH DIFFERENTIAL/PLATELET
Basophils Absolute: 0.1 10*3/uL (ref 0.0–0.1)
Basophils Relative: 0.5 % (ref 0.0–3.0)
Eosinophils Absolute: 0.3 10*3/uL (ref 0.0–0.7)
Eosinophils Relative: 2.5 % (ref 0.0–5.0)
HCT: 40.9 % (ref 36.0–46.0)
Hemoglobin: 13.9 g/dL (ref 12.0–15.0)
Lymphocytes Relative: 22 % (ref 12.0–46.0)
Lymphs Abs: 2.6 10*3/uL (ref 0.7–4.0)
MCHC: 33.9 g/dL (ref 30.0–36.0)
MCV: 87.3 fl (ref 78.0–100.0)
Monocytes Absolute: 0.9 10*3/uL (ref 0.1–1.0)
Monocytes Relative: 8.1 % (ref 3.0–12.0)
Neutro Abs: 7.8 10*3/uL — ABNORMAL HIGH (ref 1.4–7.7)
Neutrophils Relative %: 66.9 % (ref 43.0–77.0)
Platelets: 292 10*3/uL (ref 150.0–400.0)
RBC: 4.68 Mil/uL (ref 3.87–5.11)
RDW: 12.7 % (ref 11.5–15.5)
WBC: 11.6 10*3/uL — ABNORMAL HIGH (ref 4.0–10.5)

## 2022-04-15 LAB — LIPID PANEL
Cholesterol: 191 mg/dL (ref 0–200)
HDL: 43.9 mg/dL (ref >39.00)
LDL Cholesterol: 122 mg/dL — ABNORMAL HIGH (ref 0–99)
NonHDL: 147.32
Total CHOL/HDL Ratio: 4
Triglycerides: 125 mg/dL (ref 0.0–149.0)
VLDL: 25 mg/dL (ref 0.0–40.0)

## 2022-04-15 LAB — TSH: TSH: 3.73 u[IU]/mL (ref 0.35–5.50)

## 2022-04-15 LAB — C-REACTIVE PROTEIN: CRP: 2.4 mg/dL (ref 0.5–20.0)

## 2022-04-15 NOTE — Assessment & Plan Note (Signed)
Reviewed labs and a CT abd from 2017, She needs a work up to rule out autoimmune hepatitis and primary biliary cirrhosis, I have ordered LDH, repeat ANA, CRP, and other autoantibody testing. I had an extensive (45 minute) discussion about her overall picture and I advised referral to GI for the NASH.

## 2022-04-15 NOTE — Assessment & Plan Note (Signed)
Has been evaluated by hematology in the past, WBC last month was 18, will reorder CBC today for surveillance.

## 2022-04-15 NOTE — Assessment & Plan Note (Signed)
Needs new lipid panel

## 2022-04-15 NOTE — Assessment & Plan Note (Signed)
We discussed medications we could try to help with her chronic pain, I recommended starting low dose lyrica and she will follow up with me in 3 months.

## 2022-04-15 NOTE — Progress Notes (Signed)
Labs are much improved, her WBC count is down to 11.6, TSH is normal, cholesterol is stable, her inflammation is close to 0.

## 2022-04-15 NOTE — Assessment & Plan Note (Signed)
Pt has dx of fibromyalgia currently, has seen rheumatology in the past, will recheck TSH, see above discussion

## 2022-04-18 LAB — ANTI-NUCLEAR AB-TITER (ANA TITER)
ANA TITER: 1:40 {titer} — ABNORMAL HIGH
ANA Titer 1: 1:80 {titer} — ABNORMAL HIGH

## 2022-04-18 LAB — EXTRA SPECIMEN

## 2022-04-18 LAB — ANA: Anti Nuclear Antibody (ANA): POSITIVE — AB

## 2022-04-18 LAB — ANTI-DNA ANTIBODY, DOUBLE-STRANDED: ds DNA Ab: <1 IU/mL

## 2022-04-18 LAB — LACTATE DEHYDROGENASE: LDH: 204 U/L (ref 120–250)

## 2022-04-18 LAB — ANTI-SMOOTH MUSCLE ANTIBODY, IGG: Actin (Smooth Muscle) Antibody (IGG): <20 U (ref <20)

## 2022-06-09 DIAGNOSIS — Z86010 Personal history of colonic polyps: Secondary | ICD-10-CM | POA: Insufficient documentation

## 2022-06-10 ENCOUNTER — Other Ambulatory Visit: Payer: Self-pay | Admitting: Obstetrics and Gynecology

## 2022-06-10 DIAGNOSIS — Z1231 Encounter for screening mammogram for malignant neoplasm of breast: Secondary | ICD-10-CM

## 2022-06-30 ENCOUNTER — Ambulatory Visit: Payer: BC Managed Care – PPO | Admitting: Internal Medicine

## 2022-07-03 ENCOUNTER — Encounter: Payer: Self-pay | Admitting: Internal Medicine

## 2022-07-03 ENCOUNTER — Telehealth: Payer: Self-pay | Admitting: Internal Medicine

## 2022-07-03 ENCOUNTER — Other Ambulatory Visit (INDEPENDENT_AMBULATORY_CARE_PROVIDER_SITE_OTHER): Payer: BC Managed Care – PPO

## 2022-07-03 ENCOUNTER — Ambulatory Visit: Payer: BC Managed Care – PPO | Admitting: Internal Medicine

## 2022-07-03 VITALS — BP 132/68 | HR 96 | Ht 62.0 in | Wt 217.0 lb

## 2022-07-03 DIAGNOSIS — Z9189 Other specified personal risk factors, not elsewhere classified: Secondary | ICD-10-CM | POA: Diagnosis not present

## 2022-07-03 DIAGNOSIS — K625 Hemorrhage of anus and rectum: Secondary | ICD-10-CM

## 2022-07-03 DIAGNOSIS — K760 Fatty (change of) liver, not elsewhere classified: Secondary | ICD-10-CM

## 2022-07-03 DIAGNOSIS — D7389 Other diseases of spleen: Secondary | ICD-10-CM

## 2022-07-03 DIAGNOSIS — L309 Dermatitis, unspecified: Secondary | ICD-10-CM

## 2022-07-03 DIAGNOSIS — R768 Other specified abnormal immunological findings in serum: Secondary | ICD-10-CM

## 2022-07-03 LAB — COMPREHENSIVE METABOLIC PANEL
ALT: 43 U/L — ABNORMAL HIGH (ref 0–35)
AST: 55 U/L — ABNORMAL HIGH (ref 0–37)
Albumin: 3.8 g/dL (ref 3.5–5.2)
Alkaline Phosphatase: 118 U/L — ABNORMAL HIGH (ref 39–117)
BUN: 11 mg/dL (ref 6–23)
CO2: 26 mEq/L (ref 19–32)
Calcium: 9 mg/dL (ref 8.4–10.5)
Chloride: 103 mEq/L (ref 96–112)
Creatinine, Ser: 0.7 mg/dL (ref 0.40–1.20)
GFR: 89.01 mL/min (ref >60.00)
Glucose, Bld: 127 mg/dL — ABNORMAL HIGH (ref 70–99)
Potassium: 3.5 mEq/L (ref 3.5–5.1)
Sodium: 138 mEq/L (ref 135–145)
Total Bilirubin: 0.4 mg/dL (ref 0.2–1.2)
Total Protein: 7.6 g/dL (ref 6.0–8.3)

## 2022-07-03 MED ORDER — NA SULFATE-K SULFATE-MG SULF 17.5-3.13-1.6 GM/177ML PO SOLN: 1.0000 | Freq: Once | ORAL | 0 refills | Status: AC

## 2022-07-03 NOTE — Telephone Encounter (Signed)
Per Dr Carlean Purl okay to get U/S the day before procedure. Airika informed.

## 2022-07-03 NOTE — Progress Notes (Signed)
Denise Munoz 66 y.o. 12/09/1955 [id number]  Assessment & Plan:   Encounter Diagnoses  Name Primary?   NAFLD (nonalcoholic fatty liver disease) Yes   Splenic lesion    Rectal bleeding    Excessive consumption of soda pop    Perianal dermatitis    ANA positive-1-80 titer     Balance of evidence is consistent with nonalcoholic fatty liver disease.  Complete abdominal ultrasound to check this and follow-up splenic lesion.  No clinical signs of cirrhosis i.e. platelets normal.  She is at risk.  Positive ANA is often seen at such a low level as she has in NAFLD.   Rectal bleeding and IBS symptomatology will be reassessed with colonoscopy.  She had some inflammatory changes in the rectum in 2017, did not appear to be IBD.  She has an indurated anal area at this time so I want to reassess make sure she does not have IBD.  She is obese and though hemoglobin A1c only 5.2% she could be insulin resistant.  Worklife balance is not optimal and she is under stress plus she consumes too many carbohydrates including daily Pepsi 1-2 each day.  She is advised to try to address these issues after careful consideration.  I have given her references for some books related to lower carbohydrate eating and time restricted eating/intermittent fasting.  Also the diet doctor website is recommended for review.  I do not know if she would change her work habits but that would be beneficial to her health, I believe.  Calmoseptine for perianal dermatitis  Additional orders as listed  Orders Placed This Encounter  Procedures   US Abdomen Complete   Hepatitis B core antibody, total   Hepatitis B surface antibody,qualitative   Hepatitis B surface antigen   Hepatitis A antibody, total   Comprehensive metabolic panel   Ferritin   Mitochondrial antibodies   Ambulatory referral to Gastroenterology    The risks and benefits as well as alternatives of endoscopic procedure(s) have been discussed and  reviewed. All questions answered. The patient agrees to proceed.   I appreciate the opportunity to care for this patient. CC: Denise Conners, MD   Subjective:   Chief Complaint: abnormal liver test with fatty liver, IBS symptoms rectal bleeding  HPI 66 year old white woman referred by Denise Freshwater, MD because of abnormal LFTs and history of fatty liver disease.  Dr. Legrand Munoz noted that the patient had had a CT scan in 2017 showing diverticulitis but also fatty liver and hepatomegaly and a splenic lesion thought to be benign.  ANA was positive at 1-80.  Anti-smooth muscle bodies negative, anti-DNA antibodies negative C-reactive protein 2.4 on 04/15/2022.  TSH was normal also.  So was LDH, CBC showed white count 11.6 and she has a chronic mild leukocytosis, normal platelets and hemoglobin and hematocrit.   She also has IBS type issues that tend to be diarrhea predominant.  Always has loose or soft stools.  Stress will increase things.  She avoids milk but not sure that helps.  Describes 2 bowel movements each day regularly but under periods of stress can have urgent loose stools and some fecal incontinence.  During childbirth years ago she did have significant tears and required suturing.  She does have some nocturia but does not have urinary incontinence except very rarely if bladder full and coughs or sneezes.  She has had some rectal bleeding off and on and had some this week she has had left lower quadrant pain this  past 2 weeks fairly intense at times radiating to the right but has improved.  2 days ago she had some bright red blood per rectum.  No melena.  She does have anal discomfort that she attributes to hemorrhoids.  Diet history "I have a soda person".  She drinks 1 or two 8 to 12 ounce glasses of Pepsi daily.  She has quit in the past but it has been difficult.  She has been able to "get off sugar" in the past.  Worklife balance is poor she thinks.  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  Lots of deadlines and stress though she enjoys this.  She will typically have overnight oats for breakfast, at lunch she will have soup or salad either store-bought or homemade.  Dinner late at night is often a sandwich or some leftovers and then she will go right to bed.  No alcohol use. Colonoscopy 11/08/2015 - One 5 mm polyp in the ascending colon, removed with a cold snare. Resected and retrieved. - Diffuse mild inflammation was found in the rectum secondary to proctitis. Biopsied. - Moderate diverticulosis in the sigmoid colon. There was no evidence of diverticular bleeding. 1. Surgical [P], ascending, polyp - BENIGN POLYPOID COLORECTAL MUCOSA WITH A LYMPHOID AGGREGATE. - THERE IS NO EVIDENCE OF MALIGNANCY. 2. Surgical [P], rectum - MINIMALLY ACTIVE COLITIS WITH FOCAL SURFACE ULCERATION. - THERE IS NO EVIDENCE OF IDIOPATHIC INFLAMMATORY BOWEL DISEASE, DYSPLASIA, OR MALIGNANCY. - SEE COMMENT. Microscopic Comment 2. The differential diagnosis of specimen #2 includes a stercoral ulcer. (JBK:ecj 11/12/2015) 09/2015 ct IMPRESSION: 1. Uncomplicated diverticulitis involving the descending/sigmoid junction. 2. Small hiatal hernia. 3. Hepatic steatosis and hepatomegaly. 4. Advanced for age atherosclerosis.  Allergies  Allergen Reactions   Cymbalta [Duloxetine Hcl]     Tremors, fatigue, lack of motivation   Lexapro [Escitalopram]     Fatigue, lack of motivation   Doxycycline Nausea Only    diarrhea    Current Meds  Medication Sig   LUTEIN PO Take 20 mg by mouth daily.   Na Sulfate-K Sulfate-Mg Sulf 17.5-3.13-1.6 GM/177ML SOLN Take 1 kit by mouth once for 1 dose.   OVER THE COUNTER MEDICATION Align probiotic daily   VITAMIN D PO Take 125 mcg by mouth daily.   Past Medical History:  Diagnosis Date   Abnormal Pap smear of cervix ~1986   probable cryotherapy to cervix per patient   Arthritis    saw Dr. Charlestine Night  for polyarthralgia and chronic fatigue, ? fibromyalgia   Colon polyps    Diverticulitis 09/25/2015   Noted on CT scan   Fibromyalgia    Headache    history of migraines   History of mumps as a child    Hyperlipidemia 08/16/2015   IBS (irritable bowel syndrome)    Leukocytosis    seen by rheumatology and oncology in the past and per pt felt weight related   Low serum vitamin D 08/16/2015   NASH (nonalcoholic steatohepatitis)    Obesity    Scarlet fever    UTI (urinary tract infection)    Past Surgical History:  Procedure Laterality Date   ABDOMINAL HYSTERECTOMY N/A 07/17/2014   Procedure: for fibroids, HYSTERECTOMY ABDOMINAL;  Surgeon: Jamey Reas de Berton Lan, MD;  Location: [facility];  Service: Gynecology;  Laterality: N/A;   ABDOMINAL HYSTERECTOMY     COLPOSCOPY  ~1986   Normal   SALPINGOOPHORECTOMY Bilateral 07/17/2014  Procedure: SALPINGO OOPHORECTOMY with collection of pelvic washings;  Surgeon: Jamey Reas de Berton Lan, MD;  Location: [facility];  Service: Gynecology;  Laterality: Bilateral;   TONSILLECTOMY     Social History   Social History Narrative   Work or School: Stage manager Situation:      Spiritual Beliefs:      Lifestyle: admits likes sugar, soda, lots of dairy; no regular exercise      family history includes Alzheimer's disease (age of onset: 30) in her father; Bladder Cancer (age of onset: 41) in her brother; Breast cancer (age of onset: 29) in her mother; Breast cancer (age of onset: 66) in her maternal grandmother; Healthy in her son.   Review of Systems See HPI fatigue dyspnea sometimes as epistaxis.  Otherwise as per HPI or negative.  Objective:   Physical Exam _0  132/68   Pulse 96   Ht _1  (1.575 m)   Wt 217 lb (98.4 kg)   LMP 05/13/2006   BMI 39.69 kg/m @  General:  Obese, well-nourished and in no acute distress Eyes:  anicteric. ENT:   Mouth and posterior pharynx free of lesions.   Lungs: Clear to auscultation bilaterally. Heart:   S1S2, no rubs, murmurs, gallops. Abdomen:  soft, mildly tender LLQ no hepatosplenomegaly, hernia, or mass and BS+.  Rectal:  Patti [place], [place] present.    Indurated anal canal w/ decreased resting and voluntary tone. No mass or rectocele. Simulated defecation w/ reduced descent Lymph:  no cervical or supraclavicular adenopathy. Extremities:   no edema, cyanosis or clubbing Skin   no rash. Neuro:  A&O x 3.  Psych:  appropriate mood and  Affect.   Data Reviewed: See HPI

## 2022-07-03 NOTE — Patient Instructions (Addendum)
Think about the lifestyle and diet issues we discussed. You will feel better if you can make changes and make them permanent. It is challenging  but worth it!  Check out the book Stay off My Operating Table by Dr. Tracie Harrier - will tell you how to eat differently to get healthier - reviews multiple options Also The Obesity Code by Dr. Sharman Cheek  [email or web address] is an excellent website to investigate  Your provider has requested that you go to the basement level for lab work before leaving today. Press "B" on the elevator. The lab is located at the first door on the left as you exit the elevator.   Due to recent changes in healthcare laws, you may see the results of your imaging and laboratory studies on MyChart before your provider has had a chance to review them.  We understand that in some cases there may be results that are confusing or concerning to you. Not all laboratory results come back in the same time frame and the provider may be waiting for multiple results in order to interpret others.  Please give [place] 48 hours in order for your provider to thoroughly review all the results before contacting the office for clarification of your results.   You will be contacted by [facility] in the next 2 days to arrange a complete abdominal ultrasound..  The number on your caller ID will be [phone], please answer when they call.  If you have not heard from them in 2 days please call [phone] to schedule.    Try the sample of over the counter calmoseptine for the rash on your buttocks.  I appreciate the opportunity to care for you. Silvano Rusk, MD

## 2022-07-03 NOTE — Telephone Encounter (Signed)
Patient called back, had visit with Dr. Carlean Purl today, forgot to ask if it was okay for her CT to be the day before her procedure. Please advise.

## 2022-07-04 LAB — FERRITIN: Ferritin: 211.5 ng/mL (ref 10.0–291.0)

## 2022-07-08 ENCOUNTER — Ambulatory Visit (HOSPITAL_COMMUNITY)
Admission: RE | Admit: 2022-07-08 | Discharge: 2022-07-08 | Disposition: A | Discharge status: Home / Self Care | Payer: BC Managed Care – PPO | Source: Ambulatory Visit | Attending: Internal Medicine | Admitting: Internal Medicine

## 2022-07-08 DIAGNOSIS — K760 Fatty (change of) liver, not elsewhere classified: Secondary | ICD-10-CM | POA: Insufficient documentation

## 2022-07-08 DIAGNOSIS — D7389 Other diseases of spleen: Secondary | ICD-10-CM | POA: Insufficient documentation

## 2022-07-09 ENCOUNTER — Other Ambulatory Visit: Payer: Self-pay | Admitting: Internal Medicine

## 2022-07-09 ENCOUNTER — Encounter: Payer: Self-pay | Admitting: Internal Medicine

## 2022-07-09 ENCOUNTER — Other Ambulatory Visit: Payer: BC Managed Care – PPO

## 2022-07-09 ENCOUNTER — Ambulatory Visit (AMBULATORY_SURGERY_CENTER): Payer: BC Managed Care – PPO | Admitting: Internal Medicine

## 2022-07-09 VITALS — BP 148/88 | HR 76 | Temp 97.3°F | Resp 20 | Ht 62.0 in | Wt 217.0 lb

## 2022-07-09 DIAGNOSIS — K625 Hemorrhage of anus and rectum: Secondary | ICD-10-CM

## 2022-07-09 DIAGNOSIS — D124 Benign neoplasm of descending colon: Secondary | ICD-10-CM

## 2022-07-09 DIAGNOSIS — D125 Benign neoplasm of sigmoid colon: Secondary | ICD-10-CM

## 2022-07-09 DIAGNOSIS — R768 Other specified abnormal immunological findings in serum: Secondary | ICD-10-CM

## 2022-07-09 DIAGNOSIS — K529 Noninfective gastroenteritis and colitis, unspecified: Secondary | ICD-10-CM

## 2022-07-09 DIAGNOSIS — R197 Diarrhea, unspecified: Secondary | ICD-10-CM

## 2022-07-09 LAB — HEPATITIS B SURFACE ANTIGEN: Hepatitis B Surface Ag: NONREACTIVE

## 2022-07-09 LAB — HEPATITIS A ANTIBODY, TOTAL: Hepatitis A AB,Total: NONREACTIVE

## 2022-07-09 LAB — MITOCHONDRIAL ANTIBODIES: Mitochondrial M2 Ab, IgG: <=20 U (ref <=20.0)

## 2022-07-09 LAB — HEPATITIS B SURFACE ANTIBODY,QUALITATIVE: Hep B S Ab: NONREACTIVE

## 2022-07-09 LAB — HEPATITIS B CORE ANTIBODY, TOTAL: Hep B Core Total Ab: REACTIVE — AB

## 2022-07-09 MED ORDER — SODIUM CHLORIDE 0.9 % IV SOLN: 500.0000 mL | Freq: Once | INTRAVENOUS | Status: DC

## 2022-07-09 NOTE — Progress Notes (Signed)
Report to pacu rn. Vss. Care resumed by rn.

## 2022-07-09 NOTE — Progress Notes (Signed)
Called to room to assist during endoscopic procedure.  Patient ID and intended procedure confirmed with present staff. Received instructions for my participation in the procedure from the performing physician.  

## 2022-07-09 NOTE — Progress Notes (Signed)
Vital signs checked by:DT  The medical and surgical history was reviewed and verified with the patient.

## 2022-07-09 NOTE — Progress Notes (Signed)
History and Physical Interval Note:  07/09/2022 9:00 AM  Denise Munoz  has presented today for endoscopic procedure(s), with the diagnosis of  Encounter Diagnosis  Name Primary?   NAFLD (nonalcoholic fatty liver disease) Yes  .  The various methods of evaluation and treatment have been discussed with the patient and/or family. After consideration of risks, benefits and other options for treatment, the patient has consented to  the endoscopic procedure(s).   The patient's history has been reviewed, patient examined, no change in status, stable for endoscopic procedure(s).  I have reviewed the patient's chart and labs.  Questions were answered to the patient's satisfaction.     Gatha Mayer, MD, [facility]

## 2022-07-09 NOTE — Op Note (Signed)
[facility] Patient Name: Denise Munoz Procedure Date: 07/09/2022 9:02 AM MRN: [id number] Endoscopist: Gatha Mayer , MD, [id number] Age: 66 Referring MD:  Date of Birth: 28-Aug-1955 Gender: Female Account #: [id number] Procedure:                Colonoscopy Indications:              Rectal bleeding Medicines:                Monitored Anesthesia Care Procedure:                Pre-Anesthesia Assessment:                           - Prior to the procedure, a History and Physical                            was performed, and patient medications and                            allergies were reviewed. The patient's tolerance of                            previous anesthesia was also reviewed. The risks                            and benefits of the procedure and the sedation                            options and risks were discussed with the patient.                            All questions were answered, and informed consent                            was obtained. Prior Anticoagulants: The patient has                            taken no anticoagulant or antiplatelet agents. ASA                            Grade Assessment: II - A patient with mild systemic                            disease. After reviewing the risks and benefits,                            the patient was deemed in satisfactory condition to                            undergo the procedure.                           After obtaining informed consent, the colonoscope  was passed under direct vision. Throughout the                            procedure, the patient's blood pressure, pulse, and                            oxygen saturations were monitored continuously. The                            CF [id number] #[id number] was introduced through the anus                            and advanced to the the terminal ileum, with                            identification of the appendiceal orifice and  IC                            valve. The colonoscopy was performed without                            difficulty. The patient tolerated the procedure                            well. The quality of the bowel preparation was                            good. The terminal ileum, ileocecal valve,                            appendiceal orifice, and rectum were photographed.                            The bowel preparation used was SUPREP via split                            dose instruction. Scope In: 9:14:06 AM Scope Out: 9:31:58 AM Scope Withdrawal Time: 0 hours 15 minutes 12 seconds  Total Procedure Duration: 0 hours 17 minutes 52 seconds  Findings:                 The perianal exam findings include a perianal rash.                           The terminal ileum appeared normal.                           Two sessile polyps were found in the descending                            colon. The polyps were diminutive in size. These                            polyps were removed with a cold snare. Resection  and retrieval were complete. Verification of                            patient identification for the specimen was done.                            Estimated blood loss was minimal.                           An area of congested, erythematous and inflamed                            mucosa was found in the sigmoid colon and in the                            distal sigmoid colon. Biopsies were taken with a                            cold forceps for histology. Verification of patient                            identification for the specimen was done. Estimated                            blood loss was minimal.                           Multiple diverticula were found in the sigmoid                            colon, descending colon, transverse colon and                            ascending colon.                           External and internal hemorrhoids were found.                            Biopsies for histology were taken with a cold                            forceps from the ascending colon, transverse colon                            and descending colon for evaluation of microscopic                            colitis.                           The exam was otherwise without abnormality. Complications:            No immediate complications. Estimated Blood Loss:     Estimated blood loss was minimal. Impression:               -  Perianal rash found on perianal exam.                           - The examined portion of the ileum was normal.                           - Two diminutive polyps in the descending colon,                            removed with a cold snare. Resected and retrieved.                           - Congested, erythematous and inflamed mucosa in                            the sigmoid colon and in the distal sigmoid colon.                            Biopsied.                           - Diverticulosis in the sigmoid colon, in the                            descending colon, in the transverse colon and in                            the ascending colon.                           - External and internal hemorrhoids.                           - The examination was otherwise normal. Rectal                            retroflexion not tolerated due to size of rectal                            vault.                           - Biopsies were taken with a cold forceps from the                            ascending colon, transverse colon and descending                            colon for evaluation of microscopic colitis. Recommendation:           - Await pathology results.                           - Patient has a contact number available for  emergencies. The signs and symptoms of potential                            delayed complications were discussed with the                            patient. Return to  normal activities tomorrow.                            Written discharge instructions were provided to the                            patient.                           - Resume previous diet.                           - Continue present medications.                           - Repeat colonoscopy is recommended. The                            colonoscopy date will be determined after pathology                            results from today's exam become available for                            review.                           To lab today for HBV DNA re: abnormal anti-HBc Ab Gatha Mayer, MD 07/09/2022 9:48:40 AM This report has been signed electronically.

## 2022-07-09 NOTE — Patient Instructions (Addendum)
I found two tiny polyps and removed them. The lower part of the colon was inflamed some - that can be from the preparation or could represent an inflammatory condition that needs treatment. Biopsies of this and normal areas were taken.  Other findings were  diverticulosis - thickened muscle rings and pouches in the colon wall, and swollen hemorrhoids. Please read the handouts.  Please resume regular medications and typical diet.  I will be in contact regarding results and plans. You will go to the lab for another blood test to evaluate the abnormal hepatitis test, also.  I appreciate the opportunity to care for you. Gatha Mayer, MD, [facility]    Read all of the handouts given to you by your recovery room nurse.  Resume your previous medications as directed. You will go to the lab for blood work today.  YOU HAD AN ENDOSCOPIC PROCEDURE TODAY AT [facility]:   Refer to the procedure report that was given to you for any specific questions about what was found during the examination.  If the procedure report does not answer your questions, please call your gastroenterologist to clarify.  If you requested that your care partner not be given the details of your procedure findings, then the procedure report has been included in a sealed envelope for you to review at your convenience later.  YOU SHOULD EXPECT: Some feelings of bloating in the abdomen. Passage of more gas than usual.  Walking can help get rid of the air that was put into your GI tract during the procedure and reduce the bloating. If you had a lower endoscopy (such as a colonoscopy or flexible sigmoidoscopy) you may notice spotting of blood in your stool or on the toilet paper. If you underwent a bowel prep for your procedure, you may not have a normal bowel movement for a few days.  Please Note:  You might notice some irritation and congestion in your nose or some drainage.  This is from the oxygen used during your  procedure.  There is no need for concern and it should clear up in a day or so.  SYMPTOMS TO REPORT IMMEDIATELY:  Following lower endoscopy (colonoscopy or flexible sigmoidoscopy):  Excessive amounts of blood in the stool  Significant tenderness or worsening of abdominal pains  Swelling of the abdomen that is new, acute  Fever of 100F or higher    For urgent or emergent issues, a gastroenterologist can be reached at any hour by calling [phone]. Do not use MyChart messaging for urgent concerns.    DIET:  We do recommend a small meal at first, but then you may proceed to your regular diet.  Drink plenty of fluids but you should avoid alcoholic beverages for 24 hours.  ACTIVITY:  You should plan to take it easy for the rest of today and you should NOT DRIVE or use heavy machinery until tomorrow (because of the sedation medicines used during the test).    FOLLOW UP: Our staff will call the number listed on your records the next business day following your procedure.  We will call around 7:15- 8:00 am to check on you and address any questions or concerns that you may have regarding the information given to you following your procedure. If we do not reach you, we will leave a message.     If any biopsies were taken you will be contacted by phone or by letter within the next 1-3 weeks.  Please call us  at [phone] if you have not heard about the biopsies in 3 weeks.    SIGNATURES/CONFIDENTIALITY: You and/or your care partner have signed paperwork which will be entered into your electronic medical record.  These signatures attest to the fact that that the information above on your After Visit Summary has been reviewed and is understood.  Full responsibility of the confidentiality of this discharge information lies with you and/or your care-partner.

## 2022-07-10 ENCOUNTER — Telehealth: Payer: Self-pay

## 2022-07-10 NOTE — Telephone Encounter (Signed)
  Follow up Call-     07/09/2022    8:31 AM  Call back number  Post procedure Call Back phone  # [phone]  Permission to leave phone message Yes     Patient questions:  Do you have a fever, pain , or abdominal swelling? No. Pain Score  0 *  Have you tolerated food without any problems? Yes.    Have you been able to return to your normal activities? Yes.    Do you have any questions about your discharge instructions: Diet   No. Medications  No. Follow up visit  No.  Do you have questions or concerns about your Care? No.  Actions: * If pain score is 4 or above: No action needed, pain <4.

## 2022-07-13 LAB — HEPATITIS B DNA, ULTRAQUANTITATIVE, PCR
Hepatitis B DNA: NOT DETECTED IU/mL
Hepatitis B virus DNA: NOT DETECTED Log IU/mL

## 2022-07-21 ENCOUNTER — Encounter: Payer: Self-pay | Admitting: Internal Medicine

## 2022-08-04 ENCOUNTER — Ambulatory Visit
Admission: RE | Admit: 2022-08-04 | Discharge: 2022-08-04 | Disposition: A | Discharge status: Home / Self Care | Payer: BC Managed Care – PPO | Source: Ambulatory Visit | Attending: Obstetrics and Gynecology | Admitting: Obstetrics and Gynecology

## 2022-08-04 DIAGNOSIS — Z1231 Encounter for screening mammogram for malignant neoplasm of breast: Secondary | ICD-10-CM

## 2022-08-21 ENCOUNTER — Encounter: Payer: Self-pay | Admitting: Family Medicine

## 2022-08-21 ENCOUNTER — Ambulatory Visit: Payer: BC Managed Care – PPO | Admitting: Family Medicine

## 2022-08-21 VITALS — BP 136/90 | HR 85 | Temp 98.1°F | Ht 62.0 in | Wt 216.1 lb

## 2022-08-21 DIAGNOSIS — M797 Fibromyalgia: Secondary | ICD-10-CM | POA: Diagnosis not present

## 2022-08-21 DIAGNOSIS — G8929 Other chronic pain: Secondary | ICD-10-CM | POA: Diagnosis not present

## 2022-08-21 DIAGNOSIS — M2550 Pain in unspecified joint: Secondary | ICD-10-CM

## 2022-08-21 MED ORDER — CELECOXIB 100 MG PO CAPS: 100.0000 mg | ORAL_CAPSULE | Freq: Two times a day (BID) | ORAL | 2 refills | Status: DC

## 2022-08-21 MED ORDER — CYCLOBENZAPRINE HCL 10 MG PO TABS: 5.0000 mg | ORAL_TABLET | Freq: Three times a day (TID) | ORAL | 2 refills | Status: AC | PRN

## 2022-08-21 NOTE — Assessment & Plan Note (Signed)
Adverse reaction to lyrica. We discussed using celecoxib 100 mg BID and cyclobenzaprine 10 mg TID PRN for pain management. She is agreeable to this plan. I will follow up with her in 3 months via video chat to reassess her symptoms.

## 2022-08-21 NOTE — Progress Notes (Signed)
Established Patient Office Visit  Subjective   Patient ID: Denise Munoz, female    DOB: 09/12/1955  Age: 67 y.o. MRN: [id number]  Chief Complaint  Patient presents with   Medical Management of Chronic Issues   Medication Problem    Patient states she discontinued Lyrica due to this causing her to "walk into the wall"    Patient is here for follow up today. 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  Chronic pain- patient discontinued the lyrica due to imbalance issues, states she was walking into walls and doors. Patient states her pain level continues to be very high, states that some days she has difficulty getting out of bed. I extensively reviewed previous medications that have been given to the patient in the past. We discussed different strategies we could try since she has had adverse reactions to both lyrica and cymbalta.    Current Outpatient Medications  Medication Instructions   celecoxib (CELEBREX) 100 mg, Oral, 2 times daily   cyclobenzaprine (FLEXERIL) 5-10 mg, Oral, 3 times daily PRN   LUTEIN PO 20 mg, Oral, Daily   OVER THE COUNTER MEDICATION Align probiotic daily   VITAMIN D PO 125 mcg, Oral, Daily    Patient Active Problem List   Diagnosis Date Noted   Hx of adenomatous polyp of colon 06/09/2022   Left-sided nosebleed 03/17/2022   Fibromyalgia 07/16/2020   Osteoarthritis of fingers of both hands 07/16/2020   NASH (nonalcoholic steatohepatitis) 01/07/2016   Hyperlipemia 01/07/2016   Myalgia 01/07/2016   Mild episode of recurrent major depressive disorder ([place]) 01/07/2016   Vitamin D insufficiency 01/07/2016   Status post total abdominal hysterectomy 07/17/2014   Leukocytosis 05/17/2014   Chronic fatigue 05/17/2014   Chronic pain of multiple joints 05/17/2014      Review of Systems  All other systems reviewed and are negative.     Objective:     BP (!) 136/90 (BP Location: Right Arm,  Patient Position: Sitting, Cuff Size: Large)   Pulse 85   Temp 98.1 F (36.7 C) (Oral)   Ht 5' 2"$  (1.575 m)   Wt 216 lb 1.6 oz (98 kg)   LMP 05/13/2006   SpO2 95%   BMI 39.53 kg/m  BP Readings from Last 3 Encounters:  08/21/22 (!) 136/90  07/09/22 (!) 148/88  07/03/22 132/68      Physical Exam Vitals reviewed.  Constitutional:      Appearance: Normal appearance. She is well-groomed. She is obese.  Cardiovascular:     Rate and Rhythm: Normal rate and regular rhythm.     Heart sounds: S1 normal and S2 normal.  Pulmonary:     Effort: Pulmonary effort is normal.     Breath sounds: Normal air entry.  Neurological:     Mental Status: She is alert and oriented to person, place, and time. Mental status is at baseline.     Gait: Gait is intact.  Psychiatric:        Mood and Affect: Mood and affect normal.        Speech: Speech normal.        Behavior: Behavior normal.      No results found for any visits on 08/21/22.    The 10-year ASCVD risk score (Arnett DK, et al., 2019) is: 9%    Assessment & Plan:   Problem List Items Addressed This Visit       Unprioritized   Chronic pain  of multiple joints   Relevant Medications   cyclobenzaprine (FLEXERIL) 10 MG tablet   celecoxib (CELEBREX) 100 MG capsule   Fibromyalgia - Primary    Adverse reaction to lyrica. We discussed using celecoxib 100 mg BID and cyclobenzaprine 10 mg TID PRN for pain management. She is agreeable to this plan. I will follow up with her in 3 months via video chat to reassess her symptoms.       Relevant Medications   cyclobenzaprine (FLEXERIL) 10 MG tablet   celecoxib (CELEBREX) 100 MG capsule    Return in about 3 months (around 11/19/2022) for follow up on pain level -- ok to schedule video visit.    [name], MD

## 2022-09-30 ENCOUNTER — Ambulatory Visit: Payer: BC Managed Care – PPO | Admitting: Internal Medicine

## 2022-10-09 ENCOUNTER — Encounter (INDEPENDENT_AMBULATORY_CARE_PROVIDER_SITE_OTHER): Payer: BC Managed Care – PPO | Admitting: Ophthalmology

## 2022-12-18 ENCOUNTER — Other Ambulatory Visit: Payer: Self-pay | Admitting: Family Medicine

## 2022-12-18 DIAGNOSIS — G8929 Other chronic pain: Secondary | ICD-10-CM

## 2023-03-16 ENCOUNTER — Telehealth: Payer: BC Managed Care – PPO | Admitting: Physician Assistant

## 2023-03-16 DIAGNOSIS — L03313 Cellulitis of chest wall: Secondary | ICD-10-CM

## 2023-03-17 MED ORDER — CEPHALEXIN 500 MG PO CAPS: 500.0000 mg | ORAL_CAPSULE | Freq: Four times a day (QID) | ORAL | 0 refills | Status: AC

## 2023-03-17 NOTE — Progress Notes (Signed)
I have spent 5 minutes in review of e-visit questionnaire, review and updating patient chart, medical decision making and response to patient.   William Cody Martin, PA-C    

## 2023-03-17 NOTE — Progress Notes (Signed)
E Visit for Cellulitis  We are sorry that you are not feeling well. Here is how we plan to help!  Based on what you shared with me it looks like you have cellulitis.  Cellulitis looks like areas of skin redness, swelling, and warmth; it develops as a result of bacteria entering under the skin. Little red spots and/or bleeding can be seen in skin, and tiny surface sacs containing fluid can occur. Fever can be present. Cellulitis is almost always on one side of a body, and the lower limbs are the most common site of involvement.   I have prescribed:  Keflex 500mg  take one by mouth four times a day for 5 days. Giving the reoccurring nature of this lesion, you need to schedule a follow-up with your primary care provider.   HOME CARE:  Take your medications as ordered and take all of them, even if the skin irritation appears to be healing.   GET HELP RIGHT AWAY IF:  Symptoms that don't begin to go away within 48 hours. Severe redness persists or worsens If the area turns color, spreads or swells. If it blisters and opens, develops yellow-brown crust or bleeds. You develop a fever or chills. If the pain increases or becomes unbearable.  Are unable to keep fluids and food down.  MAKE SURE YOU   Understand these instructions. Will watch your condition. Will get help right away if you are not doing well or get worse.  Thank you for choosing an e-visit.  Your e-visit answers were reviewed by a board certified advanced clinical practitioner to complete your personal care plan. Depending upon the condition, your plan could have included both over the counter or prescription medications.  Please review your pharmacy choice. Make sure the pharmacy is open so you can pick up prescription now. If there is a problem, you may contact your provider through [employer] and have the prescription routed to another pharmacy.  Your safety is important to [place]. If you have drug allergies check your  prescription carefully.   For the next 24 hours you can use MyChart to ask questions about today's visit, request a non-urgent call back, or ask for a work or school excuse. You will get an email in the next two days asking about your experience. I hope that your e-visit has been valuable and will speed your recovery.

## 2023-03-28 IMAGING — MG MM DIGITAL SCREENING BILAT W/ TOMO AND CAD
8 series · 8 of 24 positions shown · non-contrast
Comparison: Previous exam(s).

CLINICAL DATA: Screening.

EXAM:
DIGITAL SCREENING BILATERAL MAMMOGRAM WITH TOMOSYNTHESIS AND CAD
TECHNIQUE: Bilateral screening digital craniocaudal and mediolateral oblique
mammograms were obtained. Bilateral screening digital breast
tomosynthesis was performed. The images were evaluated with
computer-aided detection.

[L MLO synth-2D]
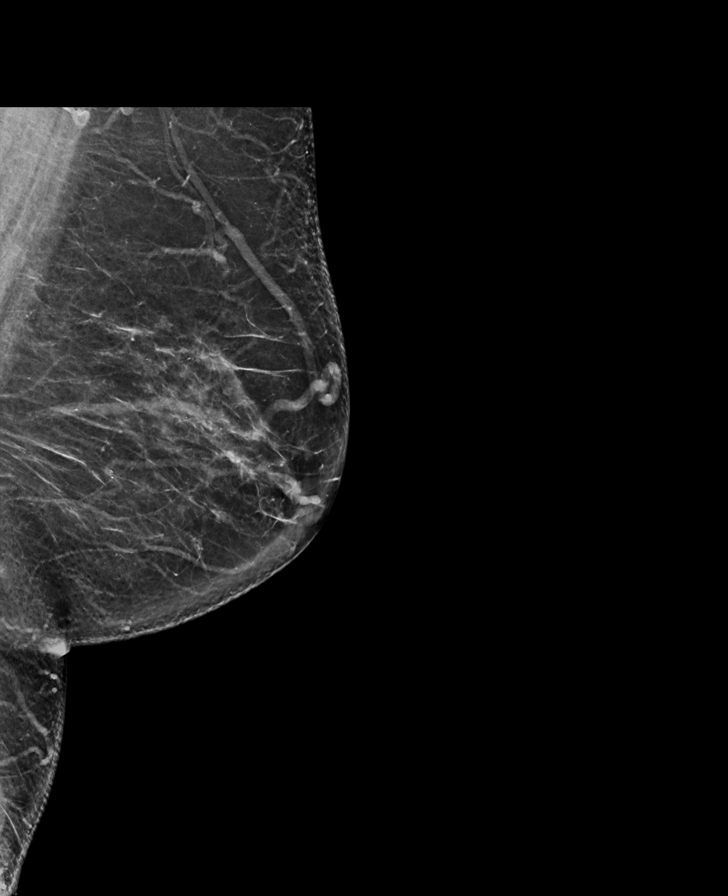

[R CC synth-2D]
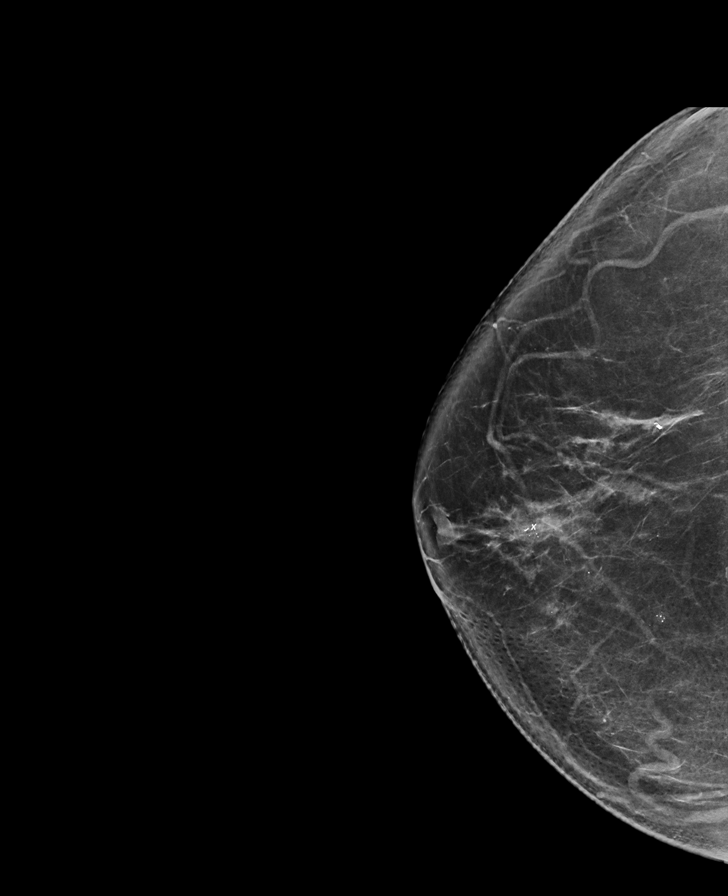

[R MLO synth-2D]
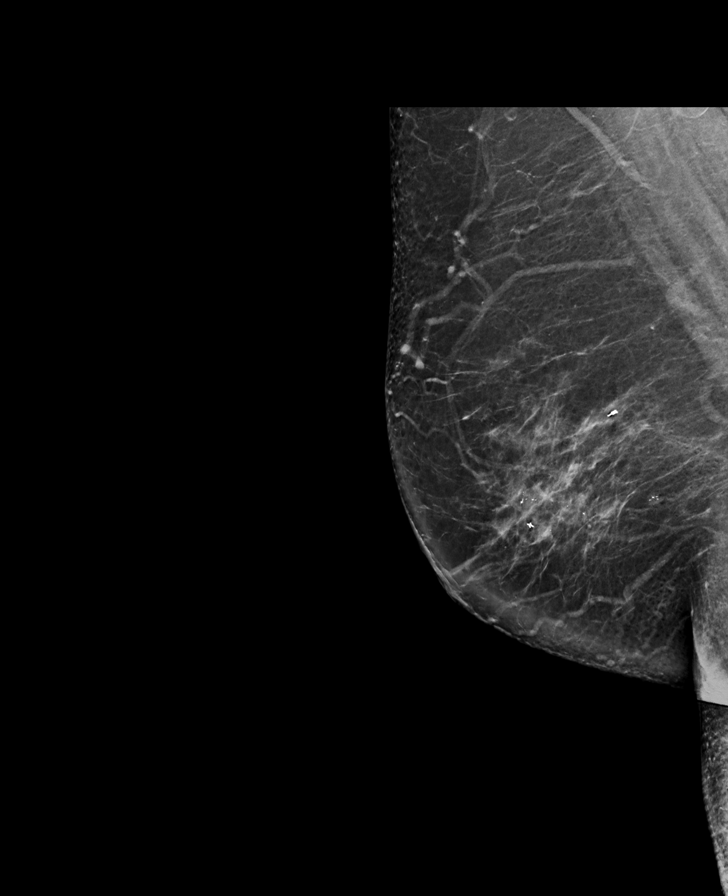

[L CC synth-2D]
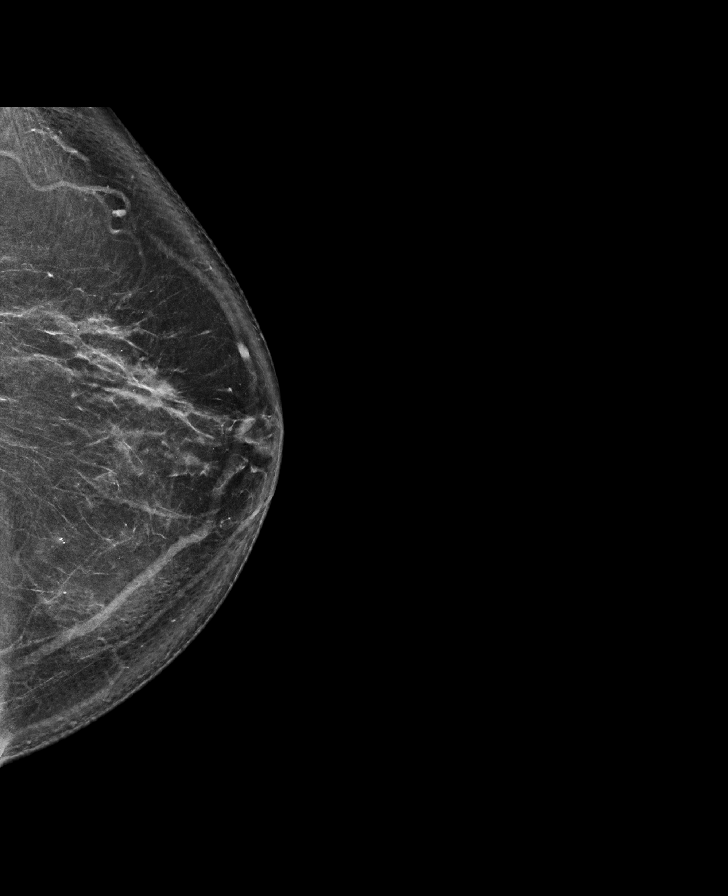

[R CC tomo · tomo slice 45/88.0]
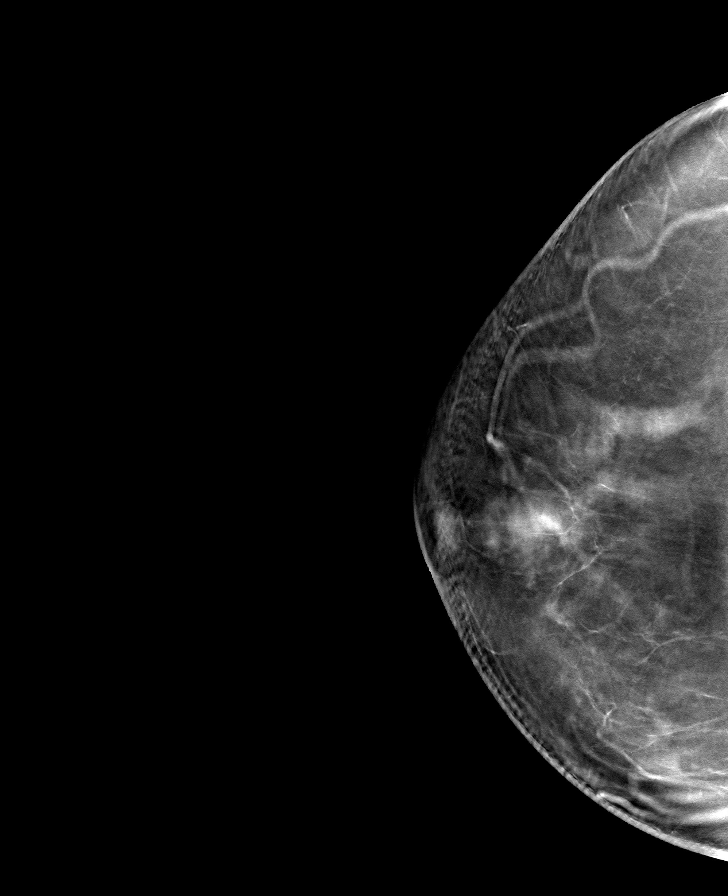

[R MLO tomo · tomo slice 45/89.0]
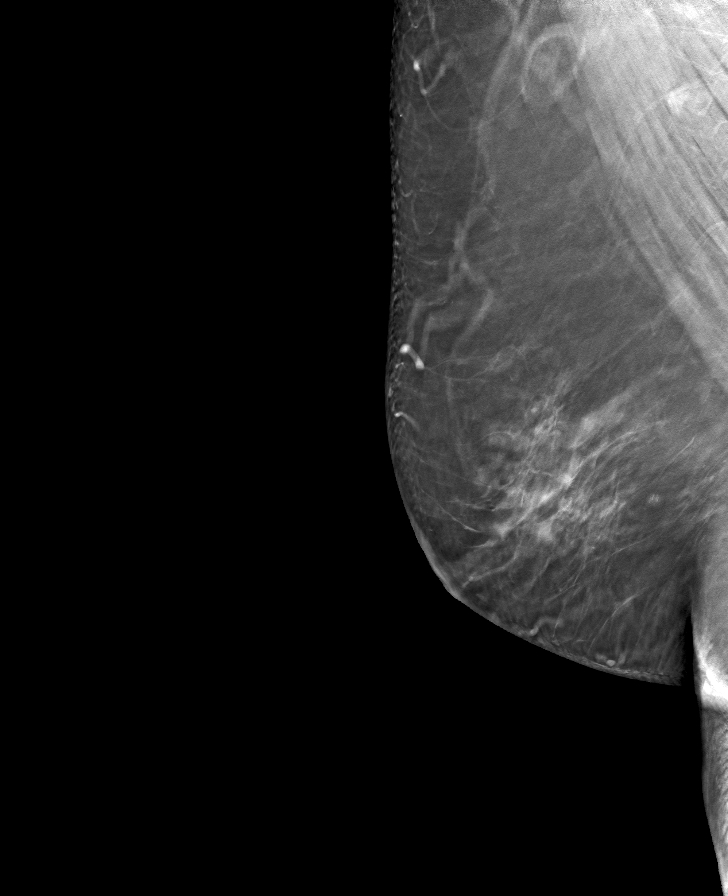

[L MLO tomo · tomo slice 43/84.0]
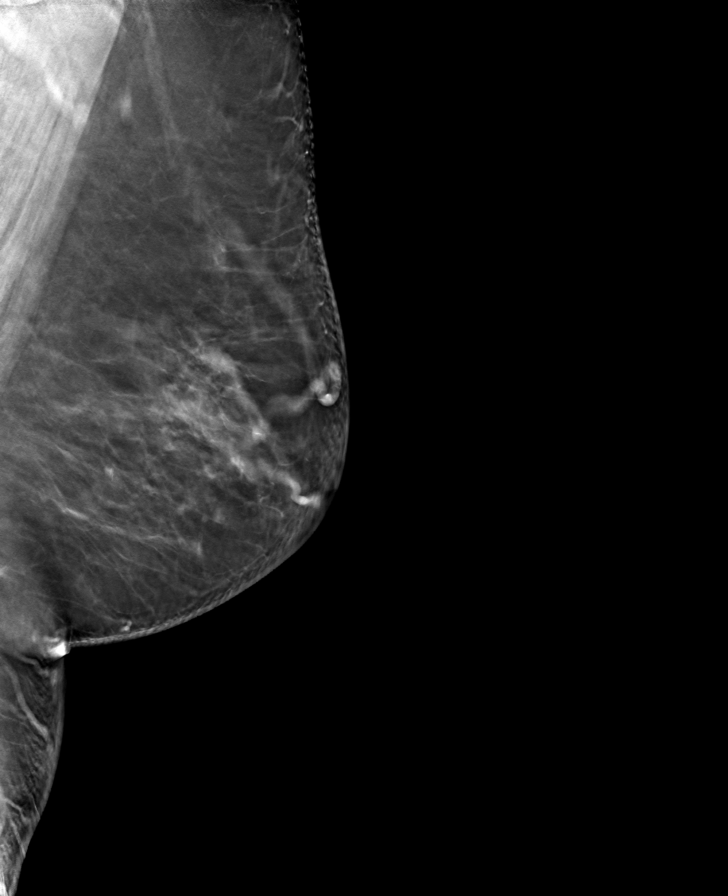

[L CC tomo · tomo slice 43/86.0]
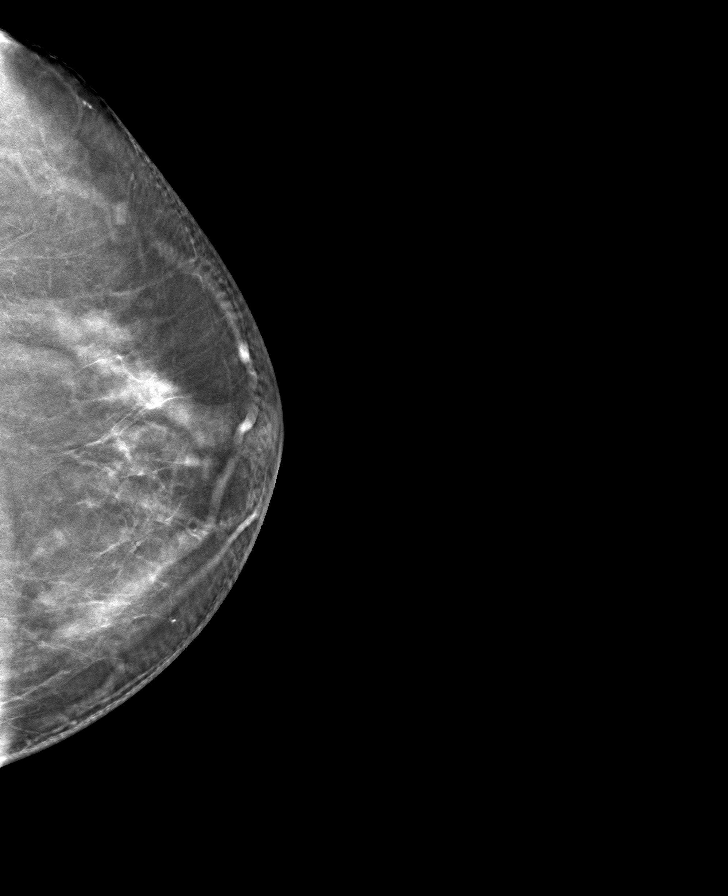

[8 of 24 positions shown; findings below may reference images not displayed]

ACR Breast Density Category b: There are scattered areas of
fibroglandular density.
FINDINGS: There are no findings suspicious for malignancy.
IMPRESSION: No mammographic evidence of malignancy. A result letter of this
screening mammogram will be mailed directly to the patient.

RECOMMENDATION:
Screening mammogram in one year. (Code:51-O-LD2)

BI-RADS CATEGORY  1: Negative.

## 2023-04-23 ENCOUNTER — Encounter (INDEPENDENT_AMBULATORY_CARE_PROVIDER_SITE_OTHER): Payer: BC Managed Care – PPO | Admitting: Ophthalmology

## 2023-04-23 DIAGNOSIS — D3131 Benign neoplasm of right choroid: Secondary | ICD-10-CM

## 2023-04-23 DIAGNOSIS — H353112 Nonexudative age-related macular degeneration, right eye, intermediate dry stage: Secondary | ICD-10-CM

## 2023-04-23 DIAGNOSIS — H353221 Exudative age-related macular degeneration, left eye, with active choroidal neovascularization: Secondary | ICD-10-CM

## 2023-04-23 DIAGNOSIS — D3132 Benign neoplasm of left choroid: Secondary | ICD-10-CM | POA: Diagnosis not present

## 2023-05-21 ENCOUNTER — Encounter (INDEPENDENT_AMBULATORY_CARE_PROVIDER_SITE_OTHER): Payer: BC Managed Care – PPO | Admitting: Ophthalmology

## 2023-05-21 DIAGNOSIS — H353112 Nonexudative age-related macular degeneration, right eye, intermediate dry stage: Secondary | ICD-10-CM

## 2023-05-21 DIAGNOSIS — D3131 Benign neoplasm of right choroid: Secondary | ICD-10-CM | POA: Diagnosis not present

## 2023-05-21 DIAGNOSIS — D3132 Benign neoplasm of left choroid: Secondary | ICD-10-CM

## 2023-05-21 DIAGNOSIS — H353221 Exudative age-related macular degeneration, left eye, with active choroidal neovascularization: Secondary | ICD-10-CM

## 2023-05-21 DIAGNOSIS — H43813 Vitreous degeneration, bilateral: Secondary | ICD-10-CM

## 2023-06-23 ENCOUNTER — Encounter (INDEPENDENT_AMBULATORY_CARE_PROVIDER_SITE_OTHER): Payer: BC Managed Care – PPO | Admitting: Ophthalmology

## 2023-06-23 DIAGNOSIS — H353221 Exudative age-related macular degeneration, left eye, with active choroidal neovascularization: Secondary | ICD-10-CM | POA: Diagnosis not present

## 2023-06-23 DIAGNOSIS — H43813 Vitreous degeneration, bilateral: Secondary | ICD-10-CM

## 2023-06-23 DIAGNOSIS — D3132 Benign neoplasm of left choroid: Secondary | ICD-10-CM

## 2023-06-23 DIAGNOSIS — D3131 Benign neoplasm of right choroid: Secondary | ICD-10-CM | POA: Diagnosis not present

## 2023-06-23 DIAGNOSIS — H353112 Nonexudative age-related macular degeneration, right eye, intermediate dry stage: Secondary | ICD-10-CM | POA: Diagnosis not present

## 2023-07-21 ENCOUNTER — Encounter (INDEPENDENT_AMBULATORY_CARE_PROVIDER_SITE_OTHER): Payer: 59 | Admitting: Ophthalmology

## 2023-07-21 DIAGNOSIS — H353221 Exudative age-related macular degeneration, left eye, with active choroidal neovascularization: Secondary | ICD-10-CM

## 2023-07-21 DIAGNOSIS — D3131 Benign neoplasm of right choroid: Secondary | ICD-10-CM

## 2023-07-21 DIAGNOSIS — H353112 Nonexudative age-related macular degeneration, right eye, intermediate dry stage: Secondary | ICD-10-CM

## 2023-07-21 DIAGNOSIS — H43813 Vitreous degeneration, bilateral: Secondary | ICD-10-CM

## 2023-07-21 DIAGNOSIS — D3132 Benign neoplasm of left choroid: Secondary | ICD-10-CM

## 2023-08-05 ENCOUNTER — Other Ambulatory Visit: Payer: Self-pay | Admitting: Family Medicine

## 2023-08-05 DIAGNOSIS — Z0000 Encounter for general adult medical examination without abnormal findings: Secondary | ICD-10-CM

## 2023-08-17 ENCOUNTER — Ambulatory Visit
Admission: RE | Admit: 2023-08-17 | Discharge: 2023-08-17 | Disposition: A | Discharge status: Home / Self Care | Payer: 59 | Source: Ambulatory Visit | Attending: Family Medicine

## 2023-08-17 DIAGNOSIS — Z0000 Encounter for general adult medical examination without abnormal findings: Secondary | ICD-10-CM

## 2023-08-20 ENCOUNTER — Encounter (INDEPENDENT_AMBULATORY_CARE_PROVIDER_SITE_OTHER): Payer: 59 | Admitting: Ophthalmology

## 2023-08-20 ENCOUNTER — Encounter: Payer: Self-pay | Admitting: Family Medicine

## 2023-08-20 DIAGNOSIS — H353221 Exudative age-related macular degeneration, left eye, with active choroidal neovascularization: Secondary | ICD-10-CM | POA: Diagnosis not present

## 2023-08-20 DIAGNOSIS — D3131 Benign neoplasm of right choroid: Secondary | ICD-10-CM | POA: Diagnosis not present

## 2023-08-20 DIAGNOSIS — D3132 Benign neoplasm of left choroid: Secondary | ICD-10-CM

## 2023-08-20 DIAGNOSIS — H43813 Vitreous degeneration, bilateral: Secondary | ICD-10-CM

## 2023-08-20 DIAGNOSIS — H353112 Nonexudative age-related macular degeneration, right eye, intermediate dry stage: Secondary | ICD-10-CM | POA: Diagnosis not present

## 2023-09-17 ENCOUNTER — Encounter (INDEPENDENT_AMBULATORY_CARE_PROVIDER_SITE_OTHER): Payer: 59 | Admitting: Ophthalmology

## 2023-09-17 DIAGNOSIS — D3132 Benign neoplasm of left choroid: Secondary | ICD-10-CM

## 2023-09-17 DIAGNOSIS — D3131 Benign neoplasm of right choroid: Secondary | ICD-10-CM

## 2023-09-17 DIAGNOSIS — H353221 Exudative age-related macular degeneration, left eye, with active choroidal neovascularization: Secondary | ICD-10-CM

## 2023-09-17 DIAGNOSIS — H43813 Vitreous degeneration, bilateral: Secondary | ICD-10-CM

## 2023-09-17 DIAGNOSIS — H353112 Nonexudative age-related macular degeneration, right eye, intermediate dry stage: Secondary | ICD-10-CM | POA: Diagnosis not present

## 2023-10-22 ENCOUNTER — Encounter (INDEPENDENT_AMBULATORY_CARE_PROVIDER_SITE_OTHER): Admitting: Ophthalmology

## 2023-10-22 DIAGNOSIS — H353221 Exudative age-related macular degeneration, left eye, with active choroidal neovascularization: Secondary | ICD-10-CM | POA: Diagnosis not present

## 2023-10-22 DIAGNOSIS — D3132 Benign neoplasm of left choroid: Secondary | ICD-10-CM | POA: Diagnosis not present

## 2023-10-22 DIAGNOSIS — H353112 Nonexudative age-related macular degeneration, right eye, intermediate dry stage: Secondary | ICD-10-CM | POA: Diagnosis not present

## 2023-10-22 DIAGNOSIS — D3131 Benign neoplasm of right choroid: Secondary | ICD-10-CM | POA: Diagnosis not present

## 2023-10-22 DIAGNOSIS — H43813 Vitreous degeneration, bilateral: Secondary | ICD-10-CM

## 2023-11-22 ENCOUNTER — Telehealth: Payer: Self-pay | Admitting: Family Medicine

## 2023-11-22 NOTE — Telephone Encounter (Signed)
 Copied from CRM [id number]. Topic: Referral - Request for Referral >> Nov 22, 2023  2:26 PM Jenice Mitts wrote: Did the patient discuss referral with their provider in the last year? Yes (If No - schedule appointment) (If Yes - send message)  Appointment offered? No  Type of order/referral and detailed reason for visit: She had a previous knee injury that she thinks might be injured again and would like an orthopedic doctor   Preference of office, provider, location: Discretion of the doctor, whoever is recommended   If referral order, have you been seen by this specialty before? No (If Yes, this issue or another issue? When? Where?  Can we respond through MyChart? Yes

## 2023-11-22 NOTE — Telephone Encounter (Signed)
 Spoke with the patient and informed her a visit is needed for evaluation prior to placing a referral and she has not been seen in over 1 yr.  Appt scheduled for 5/20.

## 2023-11-26 ENCOUNTER — Encounter (INDEPENDENT_AMBULATORY_CARE_PROVIDER_SITE_OTHER): Admitting: Ophthalmology

## 2023-11-26 DIAGNOSIS — H43813 Vitreous degeneration, bilateral: Secondary | ICD-10-CM

## 2023-11-26 DIAGNOSIS — H353221 Exudative age-related macular degeneration, left eye, with active choroidal neovascularization: Secondary | ICD-10-CM | POA: Diagnosis not present

## 2023-11-26 DIAGNOSIS — D3132 Benign neoplasm of left choroid: Secondary | ICD-10-CM | POA: Diagnosis not present

## 2023-11-26 DIAGNOSIS — H353112 Nonexudative age-related macular degeneration, right eye, intermediate dry stage: Secondary | ICD-10-CM | POA: Diagnosis not present

## 2023-11-26 DIAGNOSIS — D3131 Benign neoplasm of right choroid: Secondary | ICD-10-CM

## 2023-11-30 ENCOUNTER — Ambulatory Visit: Admitting: Family Medicine

## 2023-11-30 ENCOUNTER — Encounter: Payer: Self-pay | Admitting: Family Medicine

## 2023-11-30 ENCOUNTER — Ambulatory Visit (INDEPENDENT_AMBULATORY_CARE_PROVIDER_SITE_OTHER)

## 2023-11-30 VITALS — HR 75 | Temp 98.0°F | Ht 62.0 in | Wt 216.9 lb

## 2023-11-30 DIAGNOSIS — M25561 Pain in right knee: Secondary | ICD-10-CM

## 2023-11-30 DIAGNOSIS — M1711 Unilateral primary osteoarthritis, right knee: Secondary | ICD-10-CM

## 2023-11-30 MED ORDER — CELECOXIB 200 MG PO CAPS: 200.0000 mg | ORAL_CAPSULE | Freq: Two times a day (BID) | ORAL | 5 refills | Status: DC

## 2023-11-30 MED ORDER — METHYLPREDNISOLONE ACETATE 40 MG/ML IJ SUSP
40.0000 mg | Freq: Once | INTRAMUSCULAR | Status: AC
  Administered 2023-11-30: 40 mg via INTRA_ARTICULAR

## 2023-11-30 NOTE — Progress Notes (Signed)
 Acute Office Visit  Subjective:     Patient ID: Denise Munoz, female    DOB: 1955-08-25, 68 y.o.   MRN: [id number]  Chief Complaint  Patient presents with   Knee Pain    Patient complains of right knee pain and swelling x2 weeks,  tried Voltaren gel, ice and elevating the leg, "feels as if knee pops out of joint", denies recent injury-injured knee 23 years ago-initially injured at age 24   Pain    Patient complains of left heel pain x1 week    HPI Patient is in today for right knee pain for about 2 weeks. States that she injured her knee as a child and she has had problems with it her whole life. States that it will sometimes "pop" out of joint but it pops right back in. States that she often gets flare ups of the pain and usually it will work itself out. This time however it has not improved despite using the knee support band, stretching and exercises. She took advil  last night, also was taking celebrex  but there is still pain, no improvement. Hasn't had x-rays o a work up on the knee in years. I discussed peforming a joint injection today with the patient, risks/benefits discussed with patient and verbal consent was obtained from patient before proceeding.   Review of Systems  All other systems reviewed and are negative.       Objective:    Pulse 75   Temp 98 F (36.7 C) (Oral)   Ht 5\' 2"  (1.575 m)   Wt 216 lb 14.4 oz (98.4 kg)   LMP 05/13/2006   SpO2 97%   BMI 39.67 kg/m    Physical Exam Vitals reviewed.  Constitutional:      Appearance: Normal appearance. She is obese.  Cardiovascular:     Pulses: Normal pulses.  Pulmonary:     Effort: Pulmonary effort is normal.  Musculoskeletal:     Right knee: Swelling (around the joint line BL) present. Tenderness (medially below the level of the joint line) present.     Left knee: Normal.  Neurological:     Mental Status: She is alert.   Joint Injection/Arthrocentesis  Date/Time: 11/30/2023 3:56 PM  Performed  by: Aida House, MD Authorized by: Aida House, MD  Indications: pain and joint swelling  Body area: knee Joint: right knee Local anesthesia used: no  Anesthesia: Local anesthesia used: no  Sedation: Patient sedated: no  Needle size: 20 G Ultrasound guidance: no Approach: medial Aspirate amount: 0 mL Methylprednisolone amount: 40 mg Lidocaine  1% amount: 2 mL Patient tolerance: patient tolerated the procedure well with no immediate complications      No results found for any visits on 11/30/23.      Assessment & Plan:   Problem List Items Addressed This Visit   None Visit Diagnoses       Acute pain of right knee    -  Primary   Relevant Medications   methylPREDNISolone acetate (DEPO-MEDROL) injection 40 mg (Completed) (Start on 11/30/2023  4:00 PM)   celecoxib  (CELEBREX ) 200 MG capsule   Other Relevant Orders   DG Knee Complete 4 Views Right     Primary osteoarthritis of right knee       Relevant Medications   methylPREDNISolone acetate (DEPO-MEDROL) injection 40 mg (Completed) (Start on 11/30/2023  4:00 PM)   celecoxib  (CELEBREX ) 200 MG capsule   Other Relevant Orders   DG Knee Complete 4 Views Right  Differential diagnosis includes osteoarthritis of the knee, pes anserine bursitis vs patellofemoral syndrome. She could also have internal derangement of the meniscal areas. I have attempted aspiration of the right knee but there was no fluid present in the knee joint. I then placed  steroid injection in the knee to help with the acute pain since she failed NSAID  therapy, exercises and compression therapy at home. Ordering Xrays of the knee and if indicated will send to orhopedics. I will see her back short term for her annual physical. Increase celebrex  to 200 mg BID. New rx sent  Meds ordered this encounter  Medications   methylPREDNISolone acetate (DEPO-MEDROL) injection 40 mg   celecoxib  (CELEBREX ) 200 MG capsule    Sig: Take 1 capsule (200 mg  total) by mouth 2 (two) times daily.    Dispense:  60 capsule    Refill:  5    Return in about 3 months (around 03/01/2024) for annual physical exam.  Aida House, MD

## 2023-12-08 ENCOUNTER — Ambulatory Visit: Payer: Self-pay | Admitting: Family Medicine

## 2023-12-30 ENCOUNTER — Encounter (INDEPENDENT_AMBULATORY_CARE_PROVIDER_SITE_OTHER): Admitting: Ophthalmology

## 2023-12-30 DIAGNOSIS — H353112 Nonexudative age-related macular degeneration, right eye, intermediate dry stage: Secondary | ICD-10-CM

## 2023-12-30 DIAGNOSIS — H43813 Vitreous degeneration, bilateral: Secondary | ICD-10-CM

## 2023-12-30 DIAGNOSIS — H353221 Exudative age-related macular degeneration, left eye, with active choroidal neovascularization: Secondary | ICD-10-CM

## 2023-12-30 DIAGNOSIS — D3132 Benign neoplasm of left choroid: Secondary | ICD-10-CM | POA: Diagnosis not present

## 2023-12-30 DIAGNOSIS — D3131 Benign neoplasm of right choroid: Secondary | ICD-10-CM

## 2024-02-04 ENCOUNTER — Encounter (INDEPENDENT_AMBULATORY_CARE_PROVIDER_SITE_OTHER): Admitting: Ophthalmology

## 2024-02-04 DIAGNOSIS — H353221 Exudative age-related macular degeneration, left eye, with active choroidal neovascularization: Secondary | ICD-10-CM

## 2024-02-04 DIAGNOSIS — D3132 Benign neoplasm of left choroid: Secondary | ICD-10-CM

## 2024-02-04 DIAGNOSIS — H353112 Nonexudative age-related macular degeneration, right eye, intermediate dry stage: Secondary | ICD-10-CM | POA: Diagnosis not present

## 2024-02-04 DIAGNOSIS — D3131 Benign neoplasm of right choroid: Secondary | ICD-10-CM

## 2024-02-04 DIAGNOSIS — H43813 Vitreous degeneration, bilateral: Secondary | ICD-10-CM

## 2024-02-29 ENCOUNTER — Ambulatory Visit: Admitting: Family Medicine

## 2024-02-29 ENCOUNTER — Encounter: Payer: Self-pay | Admitting: Family Medicine

## 2024-02-29 VITALS — BP 104/80 | HR 80 | Temp 98.3°F | Ht 60.5 in | Wt 220.0 lb

## 2024-02-29 DIAGNOSIS — E559 Vitamin D deficiency, unspecified: Secondary | ICD-10-CM

## 2024-02-29 DIAGNOSIS — K7581 Nonalcoholic steatohepatitis (NASH): Secondary | ICD-10-CM | POA: Diagnosis not present

## 2024-02-29 DIAGNOSIS — Z23 Encounter for immunization: Secondary | ICD-10-CM

## 2024-02-29 DIAGNOSIS — Z131 Encounter for screening for diabetes mellitus: Secondary | ICD-10-CM

## 2024-02-29 DIAGNOSIS — Z0000 Encounter for general adult medical examination without abnormal findings: Secondary | ICD-10-CM | POA: Diagnosis not present

## 2024-02-29 DIAGNOSIS — E785 Hyperlipidemia, unspecified: Secondary | ICD-10-CM

## 2024-02-29 DIAGNOSIS — Z780 Asymptomatic menopausal state: Secondary | ICD-10-CM

## 2024-02-29 DIAGNOSIS — G4700 Insomnia, unspecified: Secondary | ICD-10-CM

## 2024-02-29 DIAGNOSIS — R5382 Chronic fatigue, unspecified: Secondary | ICD-10-CM | POA: Diagnosis not present

## 2024-02-29 LAB — LIPID PANEL
Cholesterol: 199 mg/dL (ref 0–200)
HDL: 43.1 mg/dL (ref >39.00)
LDL Cholesterol: 124 mg/dL — ABNORMAL HIGH (ref 0–99)
NonHDL: 156.11
Total CHOL/HDL Ratio: 5
Triglycerides: 161 mg/dL — ABNORMAL HIGH (ref 0.0–149.0)
VLDL: 32.2 mg/dL (ref 0.0–40.0)

## 2024-02-29 LAB — COMPREHENSIVE METABOLIC PANEL WITH GFR
ALT: 15 U/L (ref 0–35)
AST: 19 U/L (ref 0–37)
Albumin: 3.9 g/dL (ref 3.5–5.2)
Alkaline Phosphatase: 114 U/L (ref 39–117)
BUN: 19 mg/dL (ref 6–23)
CO2: 27 meq/L (ref 19–32)
Calcium: 9.1 mg/dL (ref 8.4–10.5)
Chloride: 103 meq/L (ref 96–112)
Creatinine, Ser: 0.67 mg/dL (ref 0.40–1.20)
GFR: 88.91 mL/min (ref >60.00)
Glucose, Bld: 100 mg/dL — ABNORMAL HIGH (ref 70–99)
Potassium: 3.7 meq/L (ref 3.5–5.1)
Sodium: 139 meq/L (ref 135–145)
Total Bilirubin: 0.6 mg/dL (ref 0.2–1.2)
Total Protein: 7.5 g/dL (ref 6.0–8.3)

## 2024-02-29 LAB — TSH: TSH: 1.95 u[IU]/mL (ref 0.35–5.50)

## 2024-02-29 LAB — HEMOGLOBIN A1C: Hgb A1c MFr Bld: 5.8 % (ref 4.6–6.5)

## 2024-02-29 LAB — VITAMIN D 25 HYDROXY (VIT D DEFICIENCY, FRACTURES): VITD: 35.28 ng/mL (ref 30.00–100.00)

## 2024-02-29 MED ORDER — TRAZODONE HCL 50 MG PO TABS: 25.0000 mg | ORAL_TABLET | Freq: Every evening | ORAL | 3 refills | Status: DC | PRN

## 2024-02-29 NOTE — Patient Instructions (Signed)

## 2024-02-29 NOTE — Progress Notes (Signed)
 Complete physical exam  Patient: Denise Munoz   DOB: [birth year]   68 y.o. Female  MRN: [id number]  Subjective:    Chief Complaint  Patient presents with   Annual Exam    Denise Munoz is a 68 y.o. female who presents today for a complete physical exam. She reports consuming a general diet. Does eat a lot of convenience foods, drinks protein shakes, states she cooks on the weekends. Feels like she should eat more protein. Home exercise routine includes walking 1 hrs per week and stationary bike. She generally feels well. She reports sleeping poorly, states she cannot sleep for more than 4 hours, gets up to use the restroom at night, has trouble falling back asleep . She does not have additional problems to discuss today.    Most recent fall risk assessment:    02/29/2024    8:49 AM  Fall Risk   Falls in the past year? 0  Number falls in past yr: 0  Injury with Fall? 0  Risk for fall due to : No Fall Risks  Follow up Falls evaluation completed     Most recent depression screenings:    02/29/2024    8:50 AM 08/21/2022    8:59 AM  PHQ 2/9 Scores  PHQ - 2 Score 0 4  PHQ- 9 Score 7 13    Vision:Within last year and [place] clinic, has age related MD, Dr. Alvia also and Dental: No current dental problems and Receives regular dental care  Patient Active Problem List   Diagnosis Date Noted   Hx of adenomatous polyp of colon 06/09/2022   Left-sided nosebleed 03/17/2022   Fibromyalgia 07/16/2020   Osteoarthritis of fingers of both hands 07/16/2020   NASH (nonalcoholic steatohepatitis) 01/07/2016   Hyperlipemia 01/07/2016   Myalgia 01/07/2016   Mild episode of recurrent major depressive disorder ([facility]) 01/07/2016   Vitamin D  insufficiency 01/07/2016   Status post total abdominal hysterectomy 07/17/2014   Leukocytosis 05/17/2014   Chronic fatigue 05/17/2014   Chronic pain of multiple joints 05/17/2014      Patient Care Team: Ozell Heron HERO, MD as PCP - General  (Family Medicine)   Outpatient Medications Prior to Visit  Medication Sig   celecoxib  (CELEBREX ) 200 MG capsule Take 1 capsule (200 mg total) by mouth 2 (two) times daily.   cyclobenzaprine  (FLEXERIL ) 10 MG tablet Take 0.5-1 tablets (5-10 mg total) by mouth 3 (three) times daily as needed for muscle spasms.   LUTEIN PO Take 20 mg by mouth daily.   VITAMIN D  PO Take 125 mcg by mouth daily.   No facility-administered medications prior to visit.    Review of Systems  HENT:  Negative for hearing loss.   Eyes:  Negative for blurred vision.  Respiratory:  Negative for shortness of breath.   Cardiovascular:  Negative for chest pain.  Gastrointestinal: Negative.   Genitourinary: Negative.   Musculoskeletal:  Negative for back pain.  Neurological:  Negative for headaches.  Psychiatric/Behavioral:  Negative for depression.        Objective:     BP 104/80   Pulse 80   Temp 98.3 F (36.8 C) (Oral)   Ht 5' 0.5 (1.537 m)   Wt 220 lb (99.8 kg)   LMP 05/13/2006   SpO2 96%   BMI 42.26 kg/m      Physical Exam Vitals reviewed.  Constitutional:      Appearance: Normal appearance. She is well-groomed. She is morbidly obese.  HENT:  Right Ear: Tympanic membrane and ear canal normal.     Left Ear: Tympanic membrane and ear canal normal.     Mouth/Throat:     Mouth: Mucous membranes are moist.     Pharynx: No posterior oropharyngeal erythema.  Eyes:     Conjunctiva/sclera: Conjunctivae normal.  Neck:     Thyroid : No thyromegaly.  Cardiovascular:     Rate and Rhythm: Normal rate and regular rhythm.     Pulses: Normal pulses.     Heart sounds: S1 normal and S2 normal.  Pulmonary:     Effort: Pulmonary effort is normal.     Breath sounds: Normal breath sounds and air entry.  Abdominal:     General: Abdomen is flat. Bowel sounds are normal.     Palpations: Abdomen is soft.  Musculoskeletal:     Right lower leg: No edema.     Left lower leg: No edema.  Lymphadenopathy:      Cervical: No cervical adenopathy.  Neurological:     Mental Status: She is alert and oriented to person, place, and time. Mental status is at baseline.     Gait: Gait is intact.  Psychiatric:        Mood and Affect: Mood and affect normal.        Speech: Speech normal.        Behavior: Behavior normal.        Judgment: Judgment normal.      No results found for any visits on 02/29/24.     Assessment & Plan:    Routine Health Maintenance and Physical Exam  Immunization History  Administered Date(s) Administered   PFIZER(Purple Top)SARS-COV-2 Vaccination 09/22/2019, 10/13/2019, 07/04/2020   Tdap 06/13/2011    Health Maintenance  Topic Date Due   Pneumococcal Vaccine: 50+ Years (1 of 1 - PCV) Never done   Zoster Vaccines- Shingrix (1 of 2) Never done   DEXA SCAN  Never done   DTaP/Tdap/Td (2 - Td or Tdap) 06/12/2021   COVID-19 Vaccine (4 - 2024-25 season) 03/14/2023   INFLUENZA VACCINE  04/23/2027 (Originally 02/11/2024)   MAMMOGRAM  08/16/2025   Colonoscopy  07/09/2029   Hepatitis C Screening  Completed   HPV VACCINES  Aged Out   Meningococcal B Vaccine  Aged Out    Discussed health benefits of physical activity, and encouraged her to engage in regular exercise appropriate for her age and condition.  Hyperlipidemia, unspecified hyperlipidemia type -     Lipid panel; Future  Vitamin D  insufficiency -     VITAMIN D  25 Hydroxy (Vit-D Deficiency, Fractures); Future  Postmenopausal state -     DG Bone Density; Future  Diabetes mellitus screening -     Hemoglobin A1c  NASH (nonalcoholic steatohepatitis) -     Comprehensive metabolic panel with GFR; Future  Routine general medical examination at a health care facility  Immunization due -     Pneumococcal conjugate vaccine 20-valent -     Tdap vaccine greater than or equal to 7yo IM  Disorder of initiating and maintaining sleep -     traZODone  HCl; Take 0.5-1 tablets (25-50 mg total) by mouth at bedtime as needed  for sleep.  Dispense: 30 tablet; Refill: 3  Chronic fatigue -     TSH; Future  Normal physical exam findings except for the difficulty with sleep. I counseled her on bladder training and offered a trial of trazodone  at night and she is agreeable to try the medication. I counseled the patient  on the recommended amount of exercise per CDC recommendation. I reviewed preventative screening, immunizations, and medical history and updated in the chart, and appropriate labs and vaccinations were ordered. Handouts given on healthy eating and exercise.    Return in 1 year (on 02/28/2025).     Heron CHRISTELLA Sharper, MD

## 2024-03-01 ENCOUNTER — Ambulatory Visit: Payer: Self-pay | Admitting: Family Medicine

## 2024-03-02 ENCOUNTER — Encounter: Admitting: Family Medicine

## 2024-03-03 ENCOUNTER — Encounter (INDEPENDENT_AMBULATORY_CARE_PROVIDER_SITE_OTHER): Admitting: Ophthalmology

## 2024-03-03 DIAGNOSIS — D3131 Benign neoplasm of right choroid: Secondary | ICD-10-CM | POA: Diagnosis not present

## 2024-03-03 DIAGNOSIS — H43813 Vitreous degeneration, bilateral: Secondary | ICD-10-CM

## 2024-03-03 DIAGNOSIS — D3132 Benign neoplasm of left choroid: Secondary | ICD-10-CM | POA: Diagnosis not present

## 2024-03-03 DIAGNOSIS — H353112 Nonexudative age-related macular degeneration, right eye, intermediate dry stage: Secondary | ICD-10-CM

## 2024-03-03 DIAGNOSIS — H353221 Exudative age-related macular degeneration, left eye, with active choroidal neovascularization: Secondary | ICD-10-CM | POA: Diagnosis not present

## 2024-03-23 ENCOUNTER — Other Ambulatory Visit: Payer: Self-pay | Admitting: Family Medicine

## 2024-03-23 DIAGNOSIS — G4700 Insomnia, unspecified: Secondary | ICD-10-CM

## 2024-03-31 ENCOUNTER — Encounter (INDEPENDENT_AMBULATORY_CARE_PROVIDER_SITE_OTHER): Admitting: Ophthalmology

## 2024-03-31 DIAGNOSIS — D3132 Benign neoplasm of left choroid: Secondary | ICD-10-CM | POA: Diagnosis not present

## 2024-03-31 DIAGNOSIS — H353112 Nonexudative age-related macular degeneration, right eye, intermediate dry stage: Secondary | ICD-10-CM | POA: Diagnosis not present

## 2024-03-31 DIAGNOSIS — H43813 Vitreous degeneration, bilateral: Secondary | ICD-10-CM

## 2024-03-31 DIAGNOSIS — D3131 Benign neoplasm of right choroid: Secondary | ICD-10-CM | POA: Diagnosis not present

## 2024-03-31 DIAGNOSIS — H353221 Exudative age-related macular degeneration, left eye, with active choroidal neovascularization: Secondary | ICD-10-CM | POA: Diagnosis not present

## 2024-05-05 ENCOUNTER — Encounter (INDEPENDENT_AMBULATORY_CARE_PROVIDER_SITE_OTHER): Admitting: Ophthalmology

## 2024-05-05 DIAGNOSIS — D3132 Benign neoplasm of left choroid: Secondary | ICD-10-CM | POA: Diagnosis not present

## 2024-05-05 DIAGNOSIS — D3131 Benign neoplasm of right choroid: Secondary | ICD-10-CM | POA: Diagnosis not present

## 2024-05-05 DIAGNOSIS — H353112 Nonexudative age-related macular degeneration, right eye, intermediate dry stage: Secondary | ICD-10-CM

## 2024-05-05 DIAGNOSIS — H43813 Vitreous degeneration, bilateral: Secondary | ICD-10-CM

## 2024-05-05 DIAGNOSIS — H353221 Exudative age-related macular degeneration, left eye, with active choroidal neovascularization: Secondary | ICD-10-CM

## 2024-05-15 ENCOUNTER — Ambulatory Visit: Admitting: Family Medicine

## 2024-05-15 ENCOUNTER — Encounter: Payer: Self-pay | Admitting: Family Medicine

## 2024-05-15 VITALS — HR 85 | Temp 98.1°F | Ht 60.5 in

## 2024-05-15 DIAGNOSIS — F5105 Insomnia due to other mental disorder: Secondary | ICD-10-CM | POA: Diagnosis not present

## 2024-05-15 DIAGNOSIS — F331 Major depressive disorder, recurrent, moderate: Secondary | ICD-10-CM | POA: Diagnosis not present

## 2024-05-15 DIAGNOSIS — F99 Mental disorder, not otherwise specified: Secondary | ICD-10-CM | POA: Diagnosis not present

## 2024-05-15 MED ORDER — HYDROXYZINE PAMOATE 25 MG PO CAPS: 25.0000 mg | ORAL_CAPSULE | Freq: Every evening | ORAL | 2 refills | Status: DC | PRN

## 2024-05-15 MED ORDER — SERTRALINE HCL 50 MG PO TABS: ORAL_TABLET | ORAL | 3 refills | Status: DC
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
## 2024-06-07 ENCOUNTER — Other Ambulatory Visit: Payer: Self-pay | Admitting: Family Medicine

## 2024-06-07 ENCOUNTER — Encounter (INDEPENDENT_AMBULATORY_CARE_PROVIDER_SITE_OTHER): Admitting: Ophthalmology

## 2024-06-07 DIAGNOSIS — F331 Major depressive disorder, recurrent, moderate: Secondary | ICD-10-CM

## 2024-06-07 DIAGNOSIS — H353112 Nonexudative age-related macular degeneration, right eye, intermediate dry stage: Secondary | ICD-10-CM

## 2024-06-07 DIAGNOSIS — D3132 Benign neoplasm of left choroid: Secondary | ICD-10-CM | POA: Diagnosis not present

## 2024-06-07 DIAGNOSIS — H43813 Vitreous degeneration, bilateral: Secondary | ICD-10-CM | POA: Diagnosis not present

## 2024-06-07 DIAGNOSIS — H353221 Exudative age-related macular degeneration, left eye, with active choroidal neovascularization: Secondary | ICD-10-CM | POA: Diagnosis not present

## 2024-06-07 DIAGNOSIS — F99 Mental disorder, not otherwise specified: Secondary | ICD-10-CM

## 2024-06-26 ENCOUNTER — Ambulatory Visit: Admitting: Professional

## 2024-06-26 DIAGNOSIS — F331 Major depressive disorder, recurrent, moderate: Secondary | ICD-10-CM

## 2024-06-26 NOTE — Progress Notes (Signed)
   Nathanel Collet, [facility]
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
## 2024-06-27 ENCOUNTER — Telehealth: Admitting: Family Medicine

## 2024-06-27 DIAGNOSIS — F331 Major depressive disorder, recurrent, moderate: Secondary | ICD-10-CM | POA: Insufficient documentation

## 2024-06-27 NOTE — Progress Notes (Signed)
 Virtual Medical Office Visit  Patient:  Denise Munoz      Age: 68 y.o.       Sex:  female  Date:   06/27/2024  PCP:    Ozell Heron HERO, MD   Today's Healthcare Provider: Heron HERO Ozell, MD    Assessment/Plan:   Summary assessment:  Moderate episode of recurrent major depressive disorder (HCC)   Assessment and Plan    Moderate recurrent major depressive disorder Well-managed with sertraline  50 mg. She reports significant improvement in anxiety symptoms and no side effects. Hydroxyzine  was used for sleep issues, which have improved. She is satisfied with the current dosage and does not wish to increase it. - Continue sertraline  50 mg daily. - Scheduled follow-up appointment in August for annual exam. - Advised to contact if symptoms worsen or if a dosage increase is needed.        No follow-ups on file.   She was advised to call the office or go to ER if her condition worsens    Subjective:   Denise Munoz is a 68 y.o. female with PMH significant for: Past Medical History:  Diagnosis Date   Abnormal Pap smear of cervix ~1986   probable cryotherapy to cervix per patient   Arthritis    saw Dr. Everlean for polyarthralgia and chronic fatigue, ? fibromyalgia   Colon polyps    Diverticulitis 09/25/2015   Noted on CT scan   Fibromyalgia    Headache    history of migraines   History of mumps as a child    Hyperlipidemia 08/16/2015   IBS (irritable bowel syndrome)    Leukocytosis    seen by rheumatology and oncology in the past and per pt felt weight related   Low serum vitamin D  08/16/2015   NASH (nonalcoholic steatohepatitis)    Obesity    Scarlet fever    UTI (urinary tract infection)      Presenting today with: No chief complaint on file.    She clarifies and reports that her condition: Discussed the use of AI scribe software for clinical note transcription with the patient, who gave verbal consent to proceed.  History of Present Illness    Denise Munoz is a 68 year old female who presents for medication refills and follow-up on anxiety management.  She takes sertraline  50 mg daily, which she finds very effective for anxiety without side effects. Her prior insomnia improved with hydroxyzine  as part of this regimen, and her sleep is currently better controlled.       She denies having any: Side effects to the medication          Objective/Observations  Physical Exam:  Polite and friendly Gen: NAD, resting comfortably Pulm: Normal work of breathing Neuro: Grossly normal, moves all extremities Psych: Normal affect and thought content Problem specific physical exam findings:  N/A  No images are attached to the encounter or orders placed in the encounter.    Results: No results found for any visits on 06/27/24.   No results found for this or any previous visit (from the past 2160 hours).         Virtual Visit via Video   I connected with Denise Munoz on 06/27/2024 at  9:20 AM EST by a video enabled telemedicine application and verified that I am speaking with the correct person using two identifiers. The limitations of evaluation and management by telemedicine and the availability of in person appointments were discussed. The  patient expressed understanding and agreed to proceed.   Percentage of appointment time on video:  100% Patient location: Home Provider location:  [place] [facility] Persons participating in the virtual visit: Myself and Patient

## 2024-06-30 ENCOUNTER — Ambulatory Visit: Payer: Self-pay

## 2024-06-30 ENCOUNTER — Ambulatory Visit: Admitting: Family Medicine

## 2024-06-30 NOTE — Telephone Encounter (Signed)
 Noted- ok to close.

## 2024-06-30 NOTE — Telephone Encounter (Signed)
 Appt today at 11:30am with Dr Mercer.

## 2024-06-30 NOTE — Telephone Encounter (Signed)
 FYI Only or Action Required?: FYI only for provider: appointment scheduled on 06/30/24.  Patient was last seen in primary care on 06/27/2024 by Ozell Heron HERO, MD.  Called Nurse Triage reporting Knee Pain.  Symptoms began several days ago.  Symptoms are: rapidly worsening.  Triage Disposition: See HCP Within 4 Hours (Or PCP Triage)  Patient/caregiver understands and will follow disposition?: Yes   Copied from CRM #[id number]. Topic: Clinical - Red Word Triage >> Jun 30, 2024  9:10 AM Viola F wrote: Red Word that prompted transfer to Nurse Triage: Patient having pain in right knee, can barely walk, and requesting steroid injection today >> Jun 30, 2024  9:35 AM Viola F wrote: Patient no longer wanted to hold - please reach back out  Reason for Disposition  [1] SEVERE pain (e.g., excruciating, unable to walk) AND [2] not improved after 2 hours of pain medicine  Answer Assessment - Initial Assessment Questions Pt contacted clinic to schedule an appt for steroid injection for R knee pain. Pt states that last injection was 11/2023. Pt denies any leg swelling, redness or fever. Pt states she is able to walk but that pain is worsening and she feels like she is hobbling. Pt states knee wobbled then went out from under her and pain has continued to worsen. Appointment scheduled for evaluation. Patient agrees with plan of care, and will call back if anything changes, or if symptoms worsen.      1. LOCATION and RADIATION: Where is the pain located?      R knee   2. QUALITY: What does the pain feel like?  (e.g., sharp, dull, aching, burning)     Sharp; states that knee wobbled then went out from under her  4. ONSET: When did the pain start? Does it come and go, or is it there all the time?     Recurrent  5. RECURRENT: Have you had this pain before? If Yes, ask: When, and what happened then?     Yes; hx of steroid inj for knee pain, last inj 11/2023  6. SETTING: Has  there been any recent work, exercise or other activity that involved that part of the body?      None   7. AGGRAVATING FACTORS: What makes the knee pain worse? (e.g., walking, climbing stairs, running)     Walking, able to walk but difficult   8. ASSOCIATED SYMPTOMS: Is there any swelling or redness of the knee?     Swelling at knee only; denies redness or fever   9. OTHER SYMPTOMS: Do you have any other symptoms? (e.g., calf pain, chest pain, difficulty breathing, fever)     None  Protocols used: Knee Pain-A-AH

## 2024-07-03 ENCOUNTER — Ambulatory Visit: Admitting: Family Medicine

## 2024-07-03 ENCOUNTER — Encounter: Payer: Self-pay | Admitting: Family Medicine

## 2024-07-03 ENCOUNTER — Ambulatory Visit (HOSPITAL_BASED_OUTPATIENT_CLINIC_OR_DEPARTMENT_OTHER)
Admission: RE | Admit: 2024-07-03 | Discharge: 2024-07-03 | Disposition: A | Discharge status: Home / Self Care | Source: Ambulatory Visit | Attending: Family Medicine | Admitting: Family Medicine

## 2024-07-03 VITALS — BP 118/80 | HR 85 | Temp 98.0°F

## 2024-07-03 DIAGNOSIS — Z780 Asymptomatic menopausal state: Secondary | ICD-10-CM | POA: Diagnosis present

## 2024-07-03 DIAGNOSIS — M1711 Unilateral primary osteoarthritis, right knee: Secondary | ICD-10-CM | POA: Diagnosis not present

## 2024-07-03 MED ORDER — METHYLPREDNISOLONE ACETATE 40 MG/ML IJ SUSP
40.0000 mg | Freq: Once | INTRAMUSCULAR | Status: AC
  Administered 2024-07-03: 40 mg via INTRA_ARTICULAR

## 2024-07-03 NOTE — Progress Notes (Signed)
 "  Acute Office Visit  Subjective:     Patient ID: Denise Munoz, female    DOB: 1955/08/14, 68 y.o.   MRN: [id number]  Chief Complaint  Patient presents with   Knee Pain    Patient complains of right knee pain x5 days, no known injury and felt as if knee came out of joint,  tried [facility], cold packs and aspirin with no relief    Knee Pain   Discussed the use of AI scribe software for clinical note transcription with the patient, who gave verbal consent to proceed.  History of Present Illness   Denise Munoz is a 68 year old female with knee arthritis who presents with knee pain.  She reports that on Thursday she rolled her knee with a sensation of it popping out and back in, causing significant pain and difficulty bearing weight through Friday. She notes this popping has occurred before. She had a steroid injection in May with good relief, but in the past several weeks the knee has felt more stiff and after this recent incident it became markedly swollen. She is using a compression sleeve to manage the swelling and recalls it has been about seven months since the last injection.     Review of Systems  All other systems reviewed and are negative.       Objective:    BP 118/80   Pulse 85   Temp 98 F (36.7 C) (Oral)   LMP 05/13/2006   SpO2 95%    Physical Exam Vitals reviewed.  Constitutional:      Appearance: Normal appearance. She is obese.  Cardiovascular:     Pulses: Normal pulses.  Pulmonary:     Effort: Pulmonary effort is normal.  Musculoskeletal:     Right knee: Swelling (around the joint line BL) present. Tenderness (medially below the level of the joint line) present.     Left knee: Normal.  Neurological:     Mental Status: She is alert.     No results found for any visits on 07/03/24.  Joint Injection/Arthrocentesis  Date/Time: 07/03/2024 2:25 PM  Performed by: Ozell Denise HERO, MD Authorized by: Ozell Denise HERO, MD   Indications: pain and joint swelling  Body area: knee Joint: right knee Local anesthesia used: numbing spray used topically.  Anesthesia: Local anesthesia used: numbing spray used topically.  Sedation: Patient sedated: no  Preparation: Patient was prepped and draped in the usual sterile fashion. Needle size: 20 G Ultrasound guidance: no Approach: medial Methylprednisolone  amount: 40 mg Lidocaine  1% amount: 2 mL Patient tolerance: patient tolerated the procedure well with no immediate complications        Assessment & Plan:   Problem List Items Addressed This Visit   None Visit Diagnoses       Primary osteoarthritis of right knee    -  Primary     Assessment and Plan    Primary osteoarthritis of right knee Chronic primary osteoarthritis of the right knee with recent exacerbation following a knee injury. Reports stiffness and instability with episodes of the knee popping out and back in. Previous steroid injection provided significant relief. Differential diagnosis includes potential meniscal tears or other joint abnormalities contributing to instability. - Administered steroid injection to the right knee for symptomatic relief. - we talked about getting a MRI of the knee to look for other causes of her knee pain, however pt wants to wait and continue steroid injections -- if the pain returns she will  consider getting one.  - Will discuss potential referral to an orthopedist if symptoms persist or worsen.        No orders of the defined types were placed in this encounter.   No follow-ups on file.  Denise CHRISTELLA Sharper, MD   "

## 2024-07-14 ENCOUNTER — Encounter (INDEPENDENT_AMBULATORY_CARE_PROVIDER_SITE_OTHER): Admitting: Ophthalmology

## 2024-07-14 DIAGNOSIS — D3132 Benign neoplasm of left choroid: Secondary | ICD-10-CM | POA: Diagnosis not present

## 2024-07-14 DIAGNOSIS — H43813 Vitreous degeneration, bilateral: Secondary | ICD-10-CM | POA: Diagnosis not present

## 2024-07-14 DIAGNOSIS — H353221 Exudative age-related macular degeneration, left eye, with active choroidal neovascularization: Secondary | ICD-10-CM

## 2024-07-14 DIAGNOSIS — H353112 Nonexudative age-related macular degeneration, right eye, intermediate dry stage: Secondary | ICD-10-CM

## 2024-07-19 ENCOUNTER — Ambulatory Visit: Admitting: Professional

## 2024-07-19 ENCOUNTER — Encounter: Payer: Self-pay | Admitting: Professional

## 2024-07-19 DIAGNOSIS — F331 Major depressive disorder, recurrent, moderate: Secondary | ICD-10-CM

## 2024-07-19 NOTE — Progress Notes (Signed)
 Behavioral Health Treatment Plan   Name:Denise Munoz  [employer]  MRN: [id number]  Treatment Plan Development Date: 07/19/2024  Strengths: perseveres, good intentions, creative   Supports: few friends she talks with but only talks so far.   Client Statement of Needs: 1-Help with some coping skills, 2-Figuring out how to pull myself up when I am feeling very depressed and want to stay in bed for days, Ways to find joy and interests and motivation again, 4-Self worth or self-esteem, I feel invisible, 5-I can come off as a friendly sociable person but all I want to really do is stay in bed... and my relationships have suffered  Treatment Level: Individual Therapy  Client Treatment Preferences: online, later in the day sessions (4-5 is best)  Diagnosis Depressive Disorders, Major depressive disorder, recurrent, moderate  Symptoms:  Depressed mood-indicated by subjective report or observation by others (in children and adolescents, can be irritable mood)., Loss of interest or pleasure in almost all activities-indicated by subjective report or observation by others., Significant (more than 5 percent in a month) unintentional weight loss/gain or decrease/increase in appetite (in children, failure to make expected weight gains)., Sleep disturbance (insomnia or hypersomnia)., Tiredness, fatigue, or low energy, or decreased efficiency with which routine tasks are completed., A sense of worthlessness or excessive, inappropriate, or delusional guilt (not merely self-reproach or guilt about being sick)., The symptoms cause clinically significant distress or impairment in social, occupational, or other important areas of functioning., The symptoms do not meet criteria for a mixed episode, There has never been a manic episode or hypomanic episode., and MDE is not better explained by schizophrenia spectrum or other psychotic disorders.  Goals:  Alleviate depressive  symptoms to return to effective functioning., Recognize, accept, and cope with depressed feelings., Develop healthy thinking and beliefs about self, others, and the world to alleviate and prevent relapse., and Establish healthy relationships that alleviate and prevent relapse.  Objectives: Target Date For All Objectives: one year  Verbalize positive statements about self and improve self-esteem, Verbalize an accurate understanding of depression., Identify and replace thoughts and beliefs that support depression., Learn and implement strategies to overcome depression., Explore interpersonal problems and how to resolve to assist in improving mood., Learn and implement conflict resolution skills., Learn and implement relapse prevention skills., and Use mindfulness and acceptance strategies to reduce experiential and cognitive avoidance and increase value-based behavior.  Progress Documentation:  New patient, initial plan  Interventions:  Cognitive Behavioral Therapy, Assertiveness/Communication, Psychologist, Occupational, Agricultural Consultant, Motivational Interviewing, Solution-Oriented/Positive Psychology, Environmental Manager, Insight-Oriented, and Interpersonal  Expected duration of treatment: one year  Party responsible for implementation of interventions: Rollo L. [place], [facility].  This plan has been reviewed and created by the following participants: Rollo L. Gerome, [facility] and patient Denise Munoz   A new plan will be created at least every 12 months.  The patient fully participated in the development of treatment plan with the clinician and verbally consents to such treatment.   Patient Treatment Plan Signature Obtained: No, pending signature page.   Nathanel Gerome, [facility]         [facility] Counselor/Therapist Progress Note  Patient ID: Denise Munoz, MRN: [id number],    Date: 07/19/2024  Time Spent: 59 minutes  [id number]   Treatment Type: Individual Therapy  Risk  Assessment: Danger to Self:  No Self-injurious Behavior: No Danger to Others: No  Subjective: This session was held via video teletherapy. The patient consented to the video teletherapy and  was located in her office during this session. She is aware it is the responsibility of the patient to secure confidentiality on her end of the session. The provider was in a private home office for the duration of this session.    The patient arrived on time for her Caregility session.  Issues addressed: 1-patient and Clinician developed treatment plan 2-patient fully participated and agrees with treatment plan  Interventions: see treatment plan above  Diagnosis:Major depressive disorder, recurrent episode, moderate (HCC)  Plan:  -pt to record how often she makes negative and positive statements about herself -next session will be Tuesday, August 01, 2024 at 4pm.

## 2024-07-19 NOTE — Progress Notes (Signed)
   Denise Munoz, [facility]

## 2024-08-01 ENCOUNTER — Ambulatory Visit: Admitting: Professional

## 2024-08-01 ENCOUNTER — Encounter: Payer: Self-pay | Admitting: Professional

## 2024-08-01 DIAGNOSIS — F331 Major depressive disorder, recurrent, moderate: Secondary | ICD-10-CM | POA: Diagnosis not present

## 2024-08-01 NOTE — Progress Notes (Signed)
 Behavioral Health Treatment Plan   Name:Denise Munoz  [employer]  MRN: [id number]  Treatment Plan Development Date: 07/19/2024  Strengths: perseveres, good intentions, creative   Supports: few friends she talks with but only talks so far.   Client Statement of Needs: 1-Help with some coping skills, 2-Figuring out how to pull myself up when I am feeling very depressed and want to stay in bed for days, Ways to find joy and interests and motivation again, 4-Self worth or self-esteem, I feel invisible, 5-I can come off as a friendly sociable person but all I want to really do is stay in bed... and my relationships have suffered  Treatment Level: Individual Therapy  Client Treatment Preferences: online, later in the day sessions (4-5 is best)  Diagnosis Depressive Disorders, Major depressive disorder, recurrent, moderate  Symptoms:  Depressed mood-indicated by subjective report or observation by others (in children and adolescents, can be irritable mood)., Loss of interest or pleasure in almost all activities-indicated by subjective report or observation by others., Significant (more than 5 percent in a month) unintentional weight loss/gain or decrease/increase in appetite (in children, failure to make expected weight gains)., Sleep disturbance (insomnia or hypersomnia)., Tiredness, fatigue, or low energy, or decreased efficiency with which routine tasks are completed., A sense of worthlessness or excessive, inappropriate, or delusional guilt (not merely self-reproach or guilt about being sick)., The symptoms cause clinically significant distress or impairment in social, occupational, or other important areas of functioning., The symptoms do not meet criteria for a mixed episode, There has never been a manic episode or hypomanic episode., and MDE is not better explained by schizophrenia spectrum or other psychotic disorders.  Goals:  Alleviate depressive  symptoms to return to effective functioning., Recognize, accept, and cope with depressed feelings., Develop healthy thinking and beliefs about self, others, and the world to alleviate and prevent relapse., and Establish healthy relationships that alleviate and prevent relapse.  Objectives: Target Date For All Objectives: one year  Verbalize positive statements about self and improve self-esteem, Verbalize an accurate understanding of depression., Identify and replace thoughts and beliefs that support depression., Learn and implement strategies to overcome depression., Explore interpersonal problems and how to resolve to assist in improving mood., Learn and implement conflict resolution skills., Learn and implement relapse prevention skills., and Use mindfulness and acceptance strategies to reduce experiential and cognitive avoidance and increase value-based behavior.  Progress Documentation:  New patient, initial plan  Interventions:  Cognitive Behavioral Therapy, Assertiveness/Communication, Psychologist, Occupational, Agricultural Consultant, Motivational Interviewing, Solution-Oriented/Positive Psychology, Environmental Manager, Insight-Oriented, and Interpersonal  Expected duration of treatment: one year  Party responsible for implementation of interventions: Rollo L. [place], [facility].  This plan has been reviewed and created by the following participants: Rollo L. [place], [facility] and patient Denise Munoz   A new plan will be created at least every 12 months.  The patient fully participated in the development of treatment plan with the clinician and verbally consents to such treatment.   Patient Treatment Plan Signature Obtained: No, pending signature page.   Nathanel Gerome, [facility]         [facility] Counselor/Therapist Progress Note  Patient ID: Denise Munoz, MRN: [id number],    Date: 08/01/2024  Time Spent: 63 minutes  [phone]   Treatment Type: Individual Therapy  Risk  Assessment: Danger to Self:  No Self-injurious Behavior: No Danger to Others: No  Subjective: This session was held via video teletherapy. The patient consented to the video teletherapy and  was located in her office during this session. She is aware it is the responsibility of the patient to secure confidentiality on her end of the session. The provider was in a private home office for the duration of this session.    The patient arrived on time for her Caregility session.  Issues addressed: 1-progress -have not been too bad -really busy during work week with detached push and only thinking about work -she has had lost weeks and crashed on Saturdays -work/life balance -going to gym tonight after she leaves work at 7 -pt does better at night and is a night person, -it burns off energy so she can sleep more calmly 2-sleep -she enjoys the quiet time by herself -delaying the next day -a lot of times it is she has so much to do she wants to deny and avoid -sometimes it is a stressor and her mind will not wind down 3-career -tends to overbook herself -she feels valued and appreciated and only one person who gives her no credit (because pt doesn't have a PhD) -she has pressures of needing to bring in money 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 -she feels needed and as an empty nester if defines who she is -pt feels afraid that she has enough to take her through her entire life -she is unsure what she would like to do when she retires but needs something to get her out of the front door -father's early retirement resulting in his early alzheimer's and she thinks it contributed to his mental decline -always taking on additional projects and cannot set boundaries 4-pt doesn't feel good enough -pt thinks that is a huge question -her upbringing was father being high in [place] and the belief is to not draw attention to herself and to do for others -baptist mother who  ruled -pt struggles to identify her positives: persistent, reliable 5-sleep hygiene -doesn't have a good sleep routine -bedtimes vary  Interventions: see treatment plan above  Diagnosis:Major depressive disorder, recurrent episode, moderate (HCC)  Plan:  -work on sleep hygiene -try [facility] -think about work load and how she will approach -next session will be Monday, August 14, 2024 at 3pm.

## 2024-08-14 ENCOUNTER — Ambulatory Visit: Admitting: Professional

## 2024-08-14 ENCOUNTER — Encounter: Payer: Self-pay | Admitting: Professional

## 2024-08-14 DIAGNOSIS — F331 Major depressive disorder, recurrent, moderate: Secondary | ICD-10-CM

## 2024-08-14 DIAGNOSIS — F4310 Post-traumatic stress disorder, unspecified: Secondary | ICD-10-CM

## 2024-08-17 ENCOUNTER — Telehealth: Admitting: Physician Assistant

## 2024-08-17 DIAGNOSIS — B9689 Other specified bacterial agents as the cause of diseases classified elsewhere: Secondary | ICD-10-CM

## 2024-08-17 MED ORDER — AMOXICILLIN-POT CLAVULANATE 875-125 MG PO TABS: 1.0000 | ORAL_TABLET | Freq: Two times a day (BID) | ORAL | 0 refills | Status: AC

## 2024-08-17 NOTE — Progress Notes (Signed)
"      E-Visit for Sinus Problems  We are sorry that you are not feeling well.  Here is how we plan to help!  Based on what you have shared with me it looks like you have sinusitis and possible ear infections. I do not see anything concerning for true strep, but the antibiotic I am starting (see below) would cover for that as well. Sinusitis is inflammation and infection in the sinus cavities of the head.  Based on your presentation I believe you most likely have Acute Bacterial Sinusitis.  This is an infection caused by bacteria and is treated with antibiotics. I have prescribed Augmentin  875mg /125mg  one tablet twice daily with food, for 7 days. You may use an oral decongestant such as Mucinex D or if you have glaucoma or high blood pressure use plain Mucinex. Saline nasal spray help and can safely be used as often as needed for congestion.  If you develop worsening sinus pain, fever or notice severe headache and vision changes, or if symptoms are not better after completion of antibiotic, please schedule an appointment with a health care provider.    Sinus infections are not as easily transmitted as other respiratory infection, however we still recommend that you avoid close contact with loved ones, especially the very young and elderly.  Remember to wash your hands thoroughly throughout the day as this is the number one way to prevent the spread of infection!  Home Care: Only take medications as instructed by your medical team. Complete the entire course of an antibiotic. Do not take these medications with alcohol. A steam or ultrasonic humidifier can help congestion.  You can place a towel over your head and breathe in the steam from hot water coming from a faucet. Avoid close contacts especially the very young and the elderly. Cover your mouth when you cough or sneeze. Always remember to wash your hands.  Get Help Right Away If: You develop worsening fever or sinus pain. You develop a severe  head ache or visual changes. Your symptoms persist after you have completed your treatment plan.  Make sure you Understand these instructions. Will watch your condition. Will get help right away if you are not doing well or get worse.  Your e-visit answers were reviewed by a board certified advanced clinical practitioner to complete your personal care plan.  Depending on the condition, your plan could have included both over the counter or prescription medications.  If there is a problem please reply  once you have received a response from your provider.  Your safety is important to us .  If you have drug allergies check your prescription carefully.    You can use MyChart to ask questions about todays visit, request a non-urgent call back, or ask for a work or school excuse for 24 hours related to this e-Visit. If it has been greater than 24 hours you will need to follow up with your provider, or enter a new e-Visit to address those concerns.  You will get an e-mail in the next two days asking about your experience.  I hope that your e-visit has been valuable and will speed your recovery. Thank you for using e-visits.  I have spent 5 minutes in review of e-visit questionnaire, review and updating patient chart, medical decision making and response to patient.   Elsie Velma Lunger, PA-C     "

## 2024-08-18 ENCOUNTER — Encounter (INDEPENDENT_AMBULATORY_CARE_PROVIDER_SITE_OTHER): Admitting: Ophthalmology

## 2024-08-30 ENCOUNTER — Ambulatory Visit: Admitting: Professional

## 2024-09-07 ENCOUNTER — Ambulatory Visit: Admitting: Professional

## 2024-09-11 ENCOUNTER — Ambulatory Visit: Admitting: Professional

## 2024-09-21 ENCOUNTER — Ambulatory Visit: Admitting: Professional

## 2024-09-22 ENCOUNTER — Encounter (INDEPENDENT_AMBULATORY_CARE_PROVIDER_SITE_OTHER): Admitting: Ophthalmology

## 2024-09-26 ENCOUNTER — Ambulatory Visit: Admitting: Professional

## 2024-10-03 ENCOUNTER — Ambulatory Visit: Admitting: Professional

## 2024-10-10 ENCOUNTER — Ambulatory Visit: Admitting: Professional
